# Patient Record
Sex: Male | Born: 1960 | Race: White | Hispanic: No | Marital: Married | Smoking: Former smoker
Health system: Southern US, Community
[De-identification: ages and names within clinical notes are randomized; demographics above are authoritative.]

## PROBLEM LIST (undated history)

## (undated) DIAGNOSIS — C187 Malignant neoplasm of sigmoid colon: Secondary | ICD-10-CM

## (undated) DIAGNOSIS — G5600 Carpal tunnel syndrome, unspecified upper limb: Secondary | ICD-10-CM

## (undated) DIAGNOSIS — K219 Gastro-esophageal reflux disease without esophagitis: Secondary | ICD-10-CM

## (undated) DIAGNOSIS — K429 Umbilical hernia without obstruction or gangrene: Secondary | ICD-10-CM

## (undated) DIAGNOSIS — IMO0001 Reserved for inherently not codable concepts without codable children: Secondary | ICD-10-CM

## (undated) DIAGNOSIS — E876 Hypokalemia: Secondary | ICD-10-CM

## (undated) HISTORY — DX: Carpal tunnel syndrome, unspecified upper limb: G56.00

## (undated) HISTORY — DX: Umbilical hernia without obstruction or gangrene: K42.9

## (undated) HISTORY — PX: CARPAL TUNNEL RELEASE: SHX101

## (undated) HISTORY — DX: Malignant neoplasm of sigmoid colon: C18.7

## (undated) HISTORY — PX: OTHER SURGICAL HISTORY: SHX169

## (undated) HISTORY — DX: Hypokalemia: E87.6

---

## 1980-12-14 HISTORY — PX: APPENDECTOMY: SHX54

## 2006-12-14 DIAGNOSIS — C187 Malignant neoplasm of sigmoid colon: Secondary | ICD-10-CM

## 2006-12-14 HISTORY — DX: Malignant neoplasm of sigmoid colon: C18.7

## 2007-07-07 ENCOUNTER — Inpatient Hospital Stay (HOSPITAL_COMMUNITY): Admission: EM | Admit: 2007-07-07 | Discharge: 2007-07-17 | Payer: Self-pay | Admitting: Emergency Medicine

## 2007-07-07 ENCOUNTER — Ambulatory Visit: Payer: Self-pay | Admitting: Internal Medicine

## 2007-07-08 ENCOUNTER — Encounter: Payer: Self-pay | Admitting: Internal Medicine

## 2007-07-08 ENCOUNTER — Ambulatory Visit: Payer: Self-pay | Admitting: Internal Medicine

## 2007-07-08 ENCOUNTER — Encounter (INDEPENDENT_AMBULATORY_CARE_PROVIDER_SITE_OTHER): Payer: Self-pay | Admitting: General Surgery

## 2007-07-08 HISTORY — PX: FLEXIBLE SIGMOIDOSCOPY: SHX1649

## 2007-08-19 ENCOUNTER — Ambulatory Visit (HOSPITAL_COMMUNITY): Payer: Self-pay | Admitting: Oncology

## 2007-08-24 ENCOUNTER — Ambulatory Visit (HOSPITAL_COMMUNITY): Admission: RE | Admit: 2007-08-24 | Discharge: 2007-08-24 | Payer: Self-pay | Admitting: Oncology

## 2007-09-16 ENCOUNTER — Encounter (HOSPITAL_COMMUNITY): Admission: RE | Admit: 2007-09-16 | Discharge: 2007-10-16 | Payer: Self-pay | Admitting: Oncology

## 2007-10-28 ENCOUNTER — Encounter (HOSPITAL_COMMUNITY): Admission: RE | Admit: 2007-10-28 | Discharge: 2007-11-27 | Payer: Self-pay | Admitting: Oncology

## 2007-10-28 ENCOUNTER — Ambulatory Visit (HOSPITAL_COMMUNITY): Payer: Self-pay | Admitting: Oncology

## 2007-12-05 ENCOUNTER — Encounter (HOSPITAL_COMMUNITY): Admission: RE | Admit: 2007-12-05 | Discharge: 2008-01-04 | Payer: Self-pay | Admitting: Oncology

## 2007-12-14 ENCOUNTER — Ambulatory Visit (HOSPITAL_COMMUNITY): Payer: Self-pay | Admitting: Oncology

## 2007-12-16 ENCOUNTER — Encounter (HOSPITAL_COMMUNITY): Admission: RE | Admit: 2007-12-16 | Discharge: 2008-01-15 | Payer: Self-pay | Admitting: Oncology

## 2008-01-11 ENCOUNTER — Encounter (INDEPENDENT_AMBULATORY_CARE_PROVIDER_SITE_OTHER): Payer: Self-pay | Admitting: General Surgery

## 2008-01-11 ENCOUNTER — Inpatient Hospital Stay (HOSPITAL_COMMUNITY): Admission: RE | Admit: 2008-01-11 | Discharge: 2008-01-21 | Payer: Self-pay | Admitting: General Surgery

## 2008-01-11 ENCOUNTER — Ambulatory Visit: Payer: Self-pay | Admitting: Internal Medicine

## 2008-01-11 ENCOUNTER — Ambulatory Visit (HOSPITAL_COMMUNITY): Admission: RE | Admit: 2008-01-11 | Discharge: 2008-01-11 | Payer: Self-pay | Admitting: Internal Medicine

## 2008-01-11 HISTORY — PX: COLONOSCOPY: SHX174

## 2008-03-13 ENCOUNTER — Ambulatory Visit (HOSPITAL_COMMUNITY): Payer: Self-pay | Admitting: Oncology

## 2008-03-13 ENCOUNTER — Encounter (HOSPITAL_COMMUNITY): Admission: RE | Admit: 2008-03-13 | Discharge: 2008-04-12 | Payer: Self-pay | Admitting: Oncology

## 2008-06-11 ENCOUNTER — Encounter (HOSPITAL_COMMUNITY): Admission: RE | Admit: 2008-06-11 | Discharge: 2008-07-11 | Payer: Self-pay | Admitting: Oncology

## 2008-06-11 ENCOUNTER — Ambulatory Visit (HOSPITAL_COMMUNITY): Payer: Self-pay | Admitting: Oncology

## 2008-06-12 ENCOUNTER — Ambulatory Visit: Payer: Self-pay | Admitting: Gastroenterology

## 2008-07-09 ENCOUNTER — Ambulatory Visit: Payer: Self-pay | Admitting: Internal Medicine

## 2008-07-09 ENCOUNTER — Ambulatory Visit (HOSPITAL_COMMUNITY): Admission: RE | Admit: 2008-07-09 | Discharge: 2008-07-09 | Payer: Self-pay | Admitting: Internal Medicine

## 2008-07-09 ENCOUNTER — Encounter: Payer: Self-pay | Admitting: Internal Medicine

## 2008-07-09 HISTORY — PX: COLONOSCOPY: SHX174

## 2008-09-10 ENCOUNTER — Ambulatory Visit (HOSPITAL_COMMUNITY): Payer: Self-pay | Admitting: Oncology

## 2008-09-10 ENCOUNTER — Encounter (HOSPITAL_COMMUNITY): Admission: RE | Admit: 2008-09-10 | Discharge: 2008-10-10 | Payer: Self-pay | Admitting: Oncology

## 2008-12-03 ENCOUNTER — Ambulatory Visit (HOSPITAL_COMMUNITY): Payer: Self-pay | Admitting: Oncology

## 2008-12-03 ENCOUNTER — Encounter (HOSPITAL_COMMUNITY): Admission: RE | Admit: 2008-12-03 | Discharge: 2008-12-12 | Payer: Self-pay | Admitting: Oncology

## 2009-01-07 ENCOUNTER — Encounter (HOSPITAL_COMMUNITY): Admission: RE | Admit: 2009-01-07 | Discharge: 2009-02-06 | Payer: Self-pay | Admitting: Oncology

## 2009-03-04 ENCOUNTER — Encounter (HOSPITAL_COMMUNITY): Admission: RE | Admit: 2009-03-04 | Discharge: 2009-04-03 | Payer: Self-pay | Admitting: Oncology

## 2009-03-04 ENCOUNTER — Ambulatory Visit (HOSPITAL_COMMUNITY): Payer: Self-pay | Admitting: Oncology

## 2009-06-03 ENCOUNTER — Encounter (HOSPITAL_COMMUNITY): Admission: RE | Admit: 2009-06-03 | Discharge: 2009-07-03 | Payer: Self-pay | Admitting: Oncology

## 2009-06-03 ENCOUNTER — Ambulatory Visit (HOSPITAL_COMMUNITY): Payer: Self-pay | Admitting: Oncology

## 2009-07-08 ENCOUNTER — Encounter: Payer: Self-pay | Admitting: Internal Medicine

## 2009-08-26 ENCOUNTER — Ambulatory Visit (HOSPITAL_COMMUNITY): Payer: Self-pay | Admitting: Oncology

## 2009-08-26 ENCOUNTER — Encounter (HOSPITAL_COMMUNITY): Admission: RE | Admit: 2009-08-26 | Discharge: 2009-09-11 | Payer: Self-pay | Admitting: Oncology

## 2009-11-25 ENCOUNTER — Ambulatory Visit (HOSPITAL_COMMUNITY): Payer: Self-pay | Admitting: Oncology

## 2009-11-25 ENCOUNTER — Encounter (HOSPITAL_COMMUNITY): Admission: RE | Admit: 2009-11-25 | Discharge: 2009-12-10 | Payer: Self-pay | Admitting: Oncology

## 2009-12-25 ENCOUNTER — Encounter (HOSPITAL_COMMUNITY): Admission: RE | Admit: 2009-12-25 | Discharge: 2010-01-24 | Payer: Self-pay | Admitting: Oncology

## 2009-12-27 ENCOUNTER — Encounter: Payer: Self-pay | Admitting: Internal Medicine

## 2009-12-30 ENCOUNTER — Ambulatory Visit: Payer: Self-pay | Admitting: Internal Medicine

## 2009-12-30 ENCOUNTER — Ambulatory Visit (HOSPITAL_COMMUNITY): Admission: RE | Admit: 2009-12-30 | Discharge: 2009-12-30 | Payer: Self-pay | Admitting: Internal Medicine

## 2009-12-30 HISTORY — PX: COLONOSCOPY: SHX174

## 2010-01-01 ENCOUNTER — Ambulatory Visit (HOSPITAL_COMMUNITY): Admission: RE | Admit: 2010-01-01 | Discharge: 2010-01-01 | Payer: Self-pay | Admitting: Oncology

## 2010-01-21 ENCOUNTER — Encounter: Payer: Self-pay | Admitting: Internal Medicine

## 2010-01-23 ENCOUNTER — Encounter: Payer: Self-pay | Admitting: Internal Medicine

## 2010-03-17 ENCOUNTER — Ambulatory Visit (HOSPITAL_COMMUNITY): Payer: Self-pay | Admitting: Oncology

## 2010-03-17 ENCOUNTER — Encounter (HOSPITAL_COMMUNITY): Admission: RE | Admit: 2010-03-17 | Discharge: 2010-04-16 | Payer: Self-pay | Admitting: Oncology

## 2010-04-24 ENCOUNTER — Encounter: Payer: Self-pay | Admitting: Internal Medicine

## 2010-12-26 ENCOUNTER — Encounter (HOSPITAL_COMMUNITY)
Admission: RE | Admit: 2010-12-26 | Discharge: 2011-01-13 | Payer: Self-pay | Source: Home / Self Care | Attending: Oncology | Admitting: Oncology

## 2010-12-26 ENCOUNTER — Ambulatory Visit (HOSPITAL_COMMUNITY)
Admission: RE | Admit: 2010-12-26 | Discharge: 2011-01-13 | Payer: Self-pay | Source: Home / Self Care | Attending: Oncology | Admitting: Oncology

## 2010-12-29 LAB — DIFFERENTIAL
Basophils Absolute: 0 10*3/uL (ref 0.0–0.1)
Basophils Relative: 1 % (ref 0–1)
Eosinophils Absolute: 0.3 10*3/uL (ref 0.0–0.7)
Eosinophils Relative: 4 % (ref 0–5)
Lymphocytes Relative: 35 % (ref 12–46)
Lymphs Abs: 2.4 10*3/uL (ref 0.7–4.0)
Monocytes Absolute: 0.6 10*3/uL (ref 0.1–1.0)
Monocytes Relative: 8 % (ref 3–12)
Neutro Abs: 3.6 10*3/uL (ref 1.7–7.7)
Neutrophils Relative %: 52 % (ref 43–77)

## 2010-12-29 LAB — CBC
HCT: 43 % (ref 39.0–52.0)
Hemoglobin: 14.7 g/dL (ref 13.0–17.0)
MCH: 28.4 pg (ref 26.0–34.0)
MCHC: 34.2 g/dL (ref 30.0–36.0)
MCV: 83.2 fL (ref 78.0–100.0)
Platelets: 206 10*3/uL (ref 150–400)
RBC: 5.17 MIL/uL (ref 4.22–5.81)
RDW: 14 % (ref 11.5–15.5)
WBC: 6.9 10*3/uL (ref 4.0–10.5)

## 2010-12-29 LAB — COMPREHENSIVE METABOLIC PANEL
ALT: 28 U/L (ref 0–53)
AST: 26 U/L (ref 0–37)
Albumin: 4.1 g/dL (ref 3.5–5.2)
Alkaline Phosphatase: 69 U/L (ref 39–117)
BUN: 18 mg/dL (ref 6–23)
CO2: 26 mEq/L (ref 19–32)
Calcium: 9.1 mg/dL (ref 8.4–10.5)
Chloride: 104 mEq/L (ref 96–112)
Creatinine, Ser: 1.07 mg/dL (ref 0.4–1.5)
GFR calc Af Amer: >60 mL/min (ref >60)
GFR calc non Af Amer: >60 mL/min (ref >60)
Glucose, Bld: 94 mg/dL (ref 70–99)
Potassium: 3.9 mEq/L (ref 3.5–5.1)
Sodium: 140 mEq/L (ref 135–145)
Total Bilirubin: 0.4 mg/dL (ref 0.3–1.2)
Total Protein: 6.3 g/dL (ref 6.0–8.3)

## 2010-12-29 LAB — CEA: CEA: <0.5 ng/mL (ref 0.0–5.0)

## 2011-01-04 ENCOUNTER — Encounter (HOSPITAL_COMMUNITY): Payer: Self-pay | Admitting: Oncology

## 2011-01-08 ENCOUNTER — Encounter: Payer: Self-pay | Admitting: Internal Medicine

## 2011-01-14 NOTE — Letter (Signed)
Summary: [facility]   Imported By: Diana Eves 01/23/2010 11:46:23  _____________________________________________________________________  External Attachment:    Type:   Image     Comment:   External Document

## 2011-01-14 NOTE — Letter (Signed)
Summary: [facility]   Imported By: Diana Eves 12/27/2009 14:06:41  _____________________________________________________________________  External Attachment:    Type:   Image     Comment:   External Document

## 2011-01-14 NOTE — Letter (Signed)
Summary: [facility]   Imported By: Diana Eves 12/27/2009 13:59:59  _____________________________________________________________________  External Attachment:    Type:   Image     Comment:   External Document

## 2011-01-14 NOTE — Letter (Signed)
Summary: External Other  External Other   Imported By: Peggyann Shoals 04/24/2010 09:51:11  _____________________________________________________________________  External Attachment:    Type:   Image     Comment:   External Document

## 2011-01-14 NOTE — Letter (Signed)
Summary: [facility]   Imported By: Diana Eves 01/21/2010 16:50:03  _____________________________________________________________________  External Attachment:    Type:   Image     Comment:   External Document

## 2011-01-15 NOTE — Letter (Signed)
Summary: [facility]  [facility]   Imported By: Rexene Alberts 01/08/2011 09:40:13  _____________________________________________________________________  External Attachment:    Type:   Image     Comment:   External Document

## 2011-03-02 LAB — GLUCOSE, CAPILLARY: Glucose-Capillary: 92 mg/dL (ref 70–99)

## 2011-03-04 LAB — DIFFERENTIAL
Basophils Absolute: 0 10*3/uL (ref 0.0–0.1)
Basophils Relative: 1 % (ref 0–1)
Eosinophils Absolute: 0.3 10*3/uL (ref 0.0–0.7)
Eosinophils Relative: 4 % (ref 0–5)
Lymphocytes Relative: 37 % (ref 12–46)
Lymphs Abs: 3 10*3/uL (ref 0.7–4.0)
Monocytes Absolute: 0.7 10*3/uL (ref 0.1–1.0)
Monocytes Relative: 9 % (ref 3–12)
Neutro Abs: 4 10*3/uL (ref 1.7–7.7)
Neutrophils Relative %: 50 % (ref 43–77)

## 2011-03-04 LAB — CEA: CEA: 0.8 ng/mL (ref 0.0–5.0)

## 2011-03-04 LAB — COMPREHENSIVE METABOLIC PANEL
ALT: 27 U/L (ref 0–53)
AST: 26 U/L (ref 0–37)
Albumin: 4.4 g/dL (ref 3.5–5.2)
Alkaline Phosphatase: 74 U/L (ref 39–117)
BUN: 17 mg/dL (ref 6–23)
CO2: 30 mEq/L (ref 19–32)
Calcium: 9.6 mg/dL (ref 8.4–10.5)
Chloride: 104 mEq/L (ref 96–112)
Creatinine, Ser: 1.06 mg/dL (ref 0.4–1.5)
GFR calc Af Amer: >60 mL/min (ref >60)
GFR calc non Af Amer: >60 mL/min (ref >60)
Glucose, Bld: 90 mg/dL (ref 70–99)
Potassium: 4 mEq/L (ref 3.5–5.1)
Sodium: 139 mEq/L (ref 135–145)
Total Bilirubin: 0.5 mg/dL (ref 0.3–1.2)
Total Protein: 7.3 g/dL (ref 6.0–8.3)

## 2011-03-04 LAB — CBC
HCT: 46.9 % (ref 39.0–52.0)
Hemoglobin: 15.9 g/dL (ref 13.0–17.0)
MCHC: 33.9 g/dL (ref 30.0–36.0)
MCV: 84.4 fL (ref 78.0–100.0)
Platelets: 225 10*3/uL (ref 150–400)
RBC: 5.56 MIL/uL (ref 4.22–5.81)
RDW: 13.9 % (ref 11.5–15.5)
WBC: 8.1 10*3/uL (ref 4.0–10.5)

## 2011-03-17 LAB — COMPREHENSIVE METABOLIC PANEL
ALT: 26 U/L (ref 0–53)
AST: 22 U/L (ref 0–37)
Albumin: 4.1 g/dL (ref 3.5–5.2)
Alkaline Phosphatase: 74 U/L (ref 39–117)
BUN: 15 mg/dL (ref 6–23)
CO2: 25 mEq/L (ref 19–32)
Calcium: 9.3 mg/dL (ref 8.4–10.5)
Chloride: 105 mEq/L (ref 96–112)
Creatinine, Ser: 0.88 mg/dL (ref 0.4–1.5)
GFR calc Af Amer: >60 mL/min (ref >60)
GFR calc non Af Amer: >60 mL/min (ref >60)
Glucose, Bld: 101 mg/dL — ABNORMAL HIGH (ref 70–99)
Potassium: 3.7 mEq/L (ref 3.5–5.1)
Sodium: 140 mEq/L (ref 135–145)
Total Bilirubin: 0.5 mg/dL (ref 0.3–1.2)
Total Protein: 6.9 g/dL (ref 6.0–8.3)

## 2011-03-17 LAB — DIFFERENTIAL
Basophils Absolute: 0 10*3/uL (ref 0.0–0.1)
Lymphs Abs: 2.4 10*3/uL (ref 0.7–4.0)
Monocytes Absolute: 0.3 10*3/uL (ref 0.1–1.0)
Monocytes Relative: 5 % (ref 3–12)
Neutro Abs: 3.8 10*3/uL (ref 1.7–7.7)

## 2011-03-17 LAB — CBC
HCT: 45.3 % (ref 39.0–52.0)
Hemoglobin: 15.2 g/dL (ref 13.0–17.0)
MCHC: 33.5 g/dL (ref 30.0–36.0)
MCV: 86.6 fL (ref 78.0–100.0)
Platelets: 179 10*3/uL (ref 150–400)
RBC: 5.23 MIL/uL (ref 4.22–5.81)
RDW: 13.7 % (ref 11.5–15.5)
WBC: 6.8 10*3/uL (ref 4.0–10.5)

## 2011-03-17 LAB — CEA: CEA: 0.8 ng/mL (ref 0.0–5.0)

## 2011-03-20 LAB — COMPREHENSIVE METABOLIC PANEL
CO2: 31 mEq/L (ref 19–32)
Calcium: 9.6 mg/dL (ref 8.4–10.5)
Creatinine, Ser: 1 mg/dL (ref 0.4–1.5)
GFR calc Af Amer: >60 mL/min (ref >60)
GFR calc non Af Amer: >60 mL/min (ref >60)
Glucose, Bld: 83 mg/dL (ref 70–99)

## 2011-03-20 LAB — CBC
HCT: 48.1 % (ref 39.0–52.0)
Hemoglobin: 16.3 g/dL (ref 13.0–17.0)
MCHC: 33.8 g/dL (ref 30.0–36.0)
RDW: 14.4 % (ref 11.5–15.5)

## 2011-03-23 LAB — CEA: CEA: 1 ng/mL (ref 0.0–5.0)

## 2011-03-24 ENCOUNTER — Encounter (HOSPITAL_COMMUNITY): Payer: Federal, State, Local not specified - PPO | Attending: Oncology

## 2011-03-24 DIAGNOSIS — Z85038 Personal history of other malignant neoplasm of large intestine: Secondary | ICD-10-CM | POA: Insufficient documentation

## 2011-03-24 DIAGNOSIS — C189 Malignant neoplasm of colon, unspecified: Secondary | ICD-10-CM

## 2011-03-26 LAB — CEA: CEA: 1 ng/mL (ref 0.0–5.0)

## 2011-04-28 NOTE — H&P (Signed)
[name], [name] NO.:  [id number]   MEDICAL RECORD NO.:  [id number]          PATIENT TYPE:  INP   LOCATION:  A319                          FACILITY:  [facility]   PHYSICIAN:  Osvaldo Shipper, MD     DATE OF BIRTH:  [birth year]   DATE OF ADMISSION:  07/07/2007  DATE OF DISCHARGE:  LH                              HISTORY & PHYSICAL   Patient lives in [place], [place] and does not have a PMD here.   ADMISSION DIAGNOSES:  1. Sigmoid colon obstruction, likely tumor.  2. Nausea, vomiting, constipation for the past one week.   CHIEF COMPLAINT:  Nausea, vomiting, and constipation for one week.   HISTORY OF PRESENT ILLNESS:  Patient is a 50 year old Caucasian male who  really does not have any medical problems, who was fine up until about a  month and a half ago when he was at a [employer]  for two weeks when he did not have a BM for almost one week.  Subsequently, he started having bowel movements and then about last  Friday, it was the last time he had a bowel movement.  Then about  Saturday of last week, he noticed abdominal pain, pretty much diffusely  but mostly in the lower part of the abdomen, cramping-type pain, 8/10 in  intensity, with no radiation of the pain.  Associated with nausea and  then subsequently vomiting, which started yesterday at about 7-8  episodes of emesis, not necessarily projectile.  He had some yellowish  material come out of his emesis.  He denied any blood.  He denied any  melena or any blood in his stool when he was having bowel movements.  Admits to a weight loss of about 10 pounds over the last month and a  half.  Denies any swollen lymph nodes.  He has never had a colonoscopy  in the past.   HOME MEDICATIONS:  He is not really on any medications on a regular  basis.   ALLERGIES:  CODEINE, which caused nausea and vomiting.   Past medical history consists of just surgeries in the form of  appendectomy, carpal  tunnel syndrome, and arm surgery for broken bones.  Denies any diabetes, hypertension, heart disease, strokes, previous  cancers.   SOCIAL HISTORY:  Lives in [place], [place] with his wife and daughter.  He works in the [employer] full-time.  He smokes a pack and a half  of cigarettes on a daily basis.  No alcohol use.  No illicit drug use.  Independent with his daily activities.   FAMILY HISTORY:  Father died of heart disease.  He had emphysema.  Mother had some kind of a pancreatic problem, he is not sure.  There is  a history of stroke in the grandparents, otherwise no history of colon  cancer or other cancers in the family.   REVIEW OF SYSTEMS:  GENERAL:  Positive for weakness.  HEENT:  Unremarkable.  CARDIOVASCULAR:  Unremarkable.  RESPIRATORY:  Unremarkable.  GI:  As in HPI.  GU:  Unremarkable.  MUSCULOSKELETAL:  Unremarkable.  LYMPHATICS:  Unremarkable.  NEUROLOGIC:  Unremarkable.  Other systems unremarkable.   PHYSICAL EXAMINATION:  VITAL SIGNS:  Temperature is 97.5, blood pressure  121/78, heart rate 80, respiratory rate 16, saturation 99% on room air.  GENERAL:  Well-developed and well-nourished individual in no distress.  HEENT:  There is no pallor, no icterus.  Oral mucous membranes are  moist.  No oral lesions are noted.  NECK:  Soft and supple.  No thyromegaly is appreciated.  LUNGS:  Clear to auscultation bilaterally.  CARDIOVASCULAR:  S1 and S2 is normal.  Regular.  No S3 or S4.  No  murmurs.  No rubs.  ABDOMEN:  Soft.  There is some tenderness in the lower part of the  abdomen bilaterally, right and left sides.  No rebound, rigidity, or  guarding.  No masses felt.  No hepatomegaly or splenomegaly is  appreciated.  LYMPHATICS:  Unremarkable.  MUSCULOSKELETAL:  Unremarkable.  EXTREMITIES:  No edema in the lower extremities.  Peripheral pulses are  palpable.  RECTAL:  Deferred at this time.  NEUROLOGIC:  Alert and oriented x3.  No focal neurological deficits  are  present.   LAB DATA:  White count is 19,500, hemoglobin 18, MCV 84, platelet count  311, 86% neutrophils on the differential.  His CMET shows a glucose of  136, otherwise unremarkable.  His urine shows moderate bilirubin,  ketones otherwise unremarkable.   CT scan of the abdomen and pelvis was done with contrast that showed  distended proximal colon and portions of small bowel, consistent with a  distant colonic obstruction, irregular narrowing of the sigmoid colon,  worrisome for sigmoid carcinoma was noted.  Slight prominence of the CBD  and the PD was noted.   ASSESSMENT/PLAN:  This is a 50 year old Caucasian male with no medical  problems who presents with a one week history of constipation, nausea,  vomiting, and pain and was found to have an obstructive lesion in the  sigmoid colon based on CT.  Differential includes cancer, which is the  most likely explanation.   PLAN:  1. Colonic obstruction with sigmoid mass.  GI has already seen him,      and the plan is for the patient to undergo a sigmoidoscopy tomorrow      to see if they can get biopsies.  He will probably need surgical      intervention as well.  I will defer those issues to GI.  Pain      management will be provided.  He will have an NG-tube placed per      GI.  Nicotine patch will be prescribed.  Smoking cessation      counseling provided.  2. DVT/GI prophylaxis will be provided.  3. Patient is a full code at this time.  All of his questions and his      family's questions are answered to their satisfaction.      Osvaldo Shipper, MD  Electronically Signed     GK/MEDQ  D:  07/07/2007  T:  07/07/2007  Job:  [id number]   cc:   R. Roetta Sessions, M.D.  [address]  [place]  [place] [address]   [name], M.D.  Fax: [phone]

## 2011-04-28 NOTE — Discharge Summary (Signed)
NAMEACHERON, SUGG NO.:  [id number]   MEDICAL RECORD NO.:  [id number]          PATIENT TYPE:  INP   LOCATION:  A330                          FACILITY:  [facility]   PHYSICIAN:  [name], M.D. 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   PROCEDURE:  On January 11, 2008, the patient had a colonoscopy via  colostomy by Dr. Jena Gauss and then a reversal of the [facility] procedure  with a colectomy and primary anastomosis by me on January 11, 2008 as  well.   Pathology revealed no recurrent tumor on the excised specimens.   HOSPITAL COURSE:  The hospital course was unremarkable.  Essentially,  this is a 50 year old male who presented with an obstructing sigmoid  colon.  He was treated with a colectomy and colostomy because of the  obstructive nature of his disease.  Postoperatively he did well from  that surgery and was treated by Dr. Mariel Sleet with chemotherapy, and  after the chemotherapy was terminated we then had plans to reverse his  colostomy after clearing the rest of his colon by colonoscopy.  He was  admitted on January 11, 2008, and, as stated, he underwent these  procedures uneventfully.  Postoperatively he had somewhat of a prolonged  ileus, which is understandable; however, he did well.  His surgery was  uneventful.  He underwent a stapled side-to-side anastomosis, and his  wound postoperatively remained clean.  His colostomy site was packed  open.  Wound care was administered to this area, and it was granulating  very well, and I decided to try for a closure of this.  This was carried  out on the ninth postoperative day.  On the tenth postoperative day, the  wound remained clean looking, and there was no sign of infection.  His  hospital course otherwise was unremarkable.  His NG tube was removed on  about the sixth or seventh postoperative day.  Diet and activity were  advanced as tolerated.  He continued to improve postoperatively without  any untoward signs.   LABORATORY DATA:  His pathology is as mentioned above.  His CBC and MET-  7 were grossly within normal limits postoperatively as was all of his  laboratory data postoperatively.   He is discharged now on the tenth postoperative day.  We have plans to  follow up with him next week.  He is told to contact me if there are  any problems with this colostomy site.  He was explained that this could  possibly become infected, however, to give [place] a call and that this was  not a serious problem but would require opening the wound and irrigating  it as was done prior to his closure.  He is told to contact us should  there be any other problems.   DISCHARGE MEDICATIONS:  He is to told to continue his potassium and  magnesium as per Dr. Mariel Sleet,  and he is told to clean his wound with  alcohol 2 or 3 times a day, and he is given those special instructions  regarding his previous colostomy site.  He has some Darvocet N 100 and  he is to take that every 4 hours if Advil is not enough for pain.  He is  told to contact us or come to the emergency room should there be any  acute changes.      [name], M.D.  Electronically Signed     WB/MEDQ  D:  01/21/2008  T:  01/23/2008  Job:  [id number]   cc:   Ladona Horns. Mariel Sleet, MD  Fax: [phone]

## 2011-04-28 NOTE — Op Note (Signed)
[name], MCNEILL NO.:  [id number]   MEDICAL RECORD NO.:  [id number]          PATIENT TYPE:  AMB   LOCATION:  DAY                           [facility]:  [facility]   PHYSICIAN:  [name], M.D. DATE OF BIRTH:  [birth year]   DATE OF PROCEDURE:  01/11/2008  DATE OF DISCHARGE:                               OPERATIVE REPORT   SURGEON:  [name], M.D.   PREOPERATIVE DIAGNOSIS:  Sigmoid colon carcinoma.   POSTOPERATIVE DIAGNOSIS:  Sigmoid colon carcinoma.   PROCEDURE:  Partial colectomy with anastomosis status post Hartmann  procedure (staged procedure).   SPECIMEN:  Portions of sigmoid colon.   NOTE:  This is a 50 year old white male who presented with an  obstructing sigmoid carcinoma.  He was resected and we had plans after  his chemotherapy had been completed to reverse his end colostomy.  Prior  to doing this, the GI service performed a colonoscopy through his  colostomy in order to clear his proximal colon as the visualization of  his proximal colon prior to his sigmoid resection was not adequate due  to the obstructive nature of his disease when he presented initially.  He completed his chemotherapy and his colonoscopy was planned.  He  underwent a mechanical prep as an outpatient and the GI service then  performed the colonoscopy through the colostomy and there were no  lesions found proximally.   DESCRIPTION OF PROCEDURE:  We then took him to the operating room where  we prepped his abdomen and draped in usual manner.  I isolated the  incision from the colostomy area which I dissected and then stapled free  the end of and then opened the abdomen in the midline and performed a  rather extensive adhesiolysis of his small bowel and colon in order to  free up the distal end as well as the area around his proximal colon.  I  then performed a stapled side-to-side anastomosis using the GIA and TA-  60 4.5 stapler.  The ends of the anastomosis were  then oversewn with 3-0  GI silk.  We then changed gloves, irrigated the abdomen, and there was  some oozing around where the psoas muscle was where there were  adhesions.  These were controlled with a coagulant spray.  The abdomen  was then irrigated.  We checked for hemostasis and this was complete.  There was a good anastomosis.  We then closed the abdomen in the midline  with 0 Prolene in interrupted figure-of-eight sutures.  The subcu was  irrigated and the skin was approximated with the stapling device.  The  peritoneum was closed from the inside where the colostomy was during the  procedure and we then closed the fascia with 0 Polysorb sutures and  packed it open with saline soaked gauze.  A sterile OpSite dressing was  applied over the skin incision and gauze dressing was applied over the  colostomy site.  Prior to closure, all sponge, needle and instrument  counts were found to be correct.  Estimated blood loss was approximately  300 mL.  The patient received approximately 4 liters of  crystalloid  intraoperatively.  There were no complications.      [name], M.D.  Electronically Signed     WB/MEDQ  D:  01/11/2008  T:  01/11/2008  Job:  [id number]   cc:   Ladona Horns. Mariel Sleet, MD  Fax: [phone]   R. Roetta Sessions, M.D.  [address]  [place]  [place] [address]

## 2011-04-28 NOTE — Op Note (Signed)
NAMEJAGO, CARTON            ACCOUNT NO.:  [id number]   MEDICAL RECORD NO.:  [id number]          PATIENT TYPE:  AMB   LOCATION:  DAY                           [facility]   PHYSICIAN:  R. Roetta Sessions, M.D. DATE OF BIRTH:  12/11/1961   DATE OF PROCEDURE:  07/09/2008  DATE OF DISCHARGE:                               OPERATIVE REPORT   Colonoscopy with  snare polypectomy.   INDICATIONS FOR PROCEDURE:  A 50 year old gentleman with a sigmoid colon  cancer status post resection one year ago with a diverting colostomy,  was taken down earlier this year.  He is here for one-year surveillance  of his neo lower GI tract.  This approach has been discussed with the  patient at length.  Risks, benefits, and alternatives have been  reviewed.  Questions were answered.  He is agreeable.  Please see the  documentation in the medical record.   PROCEDURE NOTE:  O2 saturation, blood pressure, and pulse of the patient  monitored throughout the entire procedure.  Conscious sedation, Versed 6  mg IV, and Demerol 150 mg IV in divided doses.   INSTRUMENT:  Pentax video chip system.   FINDINGS:  Digital rectal exam revealed no abnormalities.  Endoscopic  findings:  The prep was adequate, but suboptimal on the right side.  Colon:  Colonic mucosa was surveyed from the rectosigmoid junction  through the left transverse, right colon, appendiceal orifice, ileocecal  valve, and cecum.  These structures were well seen and photographed for  the record.  From this level, the scope was slowly and cautiously  withdrawn.  All previously mentioned mucosal surfaces were again seen.  The encircled anastomosis was identified at 22 cm from the anal verge.  There was a 5-mm polypoid lesion at the anastomosis found and appeared  to be possibly an adenomatous polyp or some amount of granulation tissue  from a recent surgery.  Please see photos.  The remainder of the colonic  mucosa appeared normal.  This lesion  was resected with a hot snare  cautery, recovered through the scope.  The remainder of the residual  colon appeared normal.  Scope was pulled down to rectum where thorough  examination of the rectal mucosa including retroflexed view of the anal  verge demonstrated no abnormalities.  The patient tolerated the  procedure well and was reactive in endoscopy.   IMPRESSION:  Normal rectum, surgical anastomosis was 22 cm, polypoid  tissue at anastomosis that was a recurrent neoplasm, it was removed with  hot snare.  The remainder of residual colon appeared normal.  Relative  poor prep on the right side made exam more challenging.   RECOMMENDATIONS:  1. No aspirin or arthritis medications for 5 days.  2. Follow up on path.  3. Further recommendations to follow.      [name], M.D.  Electronically Signed     RMR/MEDQ  D:  07/09/2008  T:  07/10/2008  Job:  [id number]   cc:   [name], M.D.  Fax: [phone]   Ladona Horns. Mariel Sleet, MD  Fax: [phone]   Dossie Arbour, M.D.

## 2011-04-28 NOTE — Discharge Summary (Signed)
NAMEGAYLON, BENTZ NO.:  [id number]   MEDICAL RECORD NO.:  [id number]          PATIENT TYPE:  INP   LOCATION:  A319                          [facility]:  [facility]   PHYSICIAN:  [name], M.D. DATE OF BIRTH:  1961/09/04   DATE OF ADMISSION:  07/07/2007  DATE OF DISCHARGE:  08/03/2008LH                               DISCHARGE SUMMARY   DIAGNOSIS:  Obstructing sigmoid carcinoma.   CONSULTATIONS:  1. GI service.  2. Surgery Service.   PROCEDURE:  1. On July 08, 2007, flexible sigmoidoscopy with polypectomy and      biopsy of sigmoid carcinoma.  2. On July 08, 2007, sigmoid resection with end colostomy (staged      procedure).   PATHOLOGY REPORT:  Moderately differentiated invasive adenocarcinoma of  the sigmoid colon, margins clear, maximum tumor size 4 cm, 6 out of 22  lymph nodes with metastatic adenocarcinoma, distal margin 1 cm, proximal  margin free of tumor as well.   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  He underwent a flexible sigmoidoscopy which revealed a tumor  totally obstructing the sigmoid colon.  He was decompressed with NG  suction and parenteral antibiotics were initiated and Surgery was  consulted and he was taken to surgery, at which time the obstruction was  relieved by a sigmoid resection and an end colostomy, which revealed the  above-mentioned pathology; this is a staged procedure and the patient  will undergo reanastomosis and further resection of edges of tumor to  make sure that we remain margin-free.   HOSPITAL STAY:  This patient did well postoperatively.  He did not  require any intensive care unit stay.  He did have a prolonged ileus,  which was expected, as this patient had had literally weeks of  obstructive symptoms and a very dilated atonic bowel which took some  time to regain its tone and normal peristalsis.   The patient  postoperatively did well, apart from that, and his NG tube was removed  on the 7th postoperative day and on the 9th postoperative day, he was  tolerating p.o. and soft diet without nausea or vomiting.  His colostomy  function was good with good stoma viability and the patient had been  instructed by the enterostomy nurse.  At the time of discharge, he was  afebrile.  His wounds were clean, I removed alternate staples and have  made followup arrangements to see him later and he was doing well  without dysuria, shortness of breath or any signs of wound infection.   LABORATORY DATA:  Pathology report as mentioned above.  His white count  on July 31 was 8.9 with an H&H of 13.8 and 41.3, normal differential.  His electrolytes were within normal limits and his preoperative CEA  level was of 2.1.   CT scan showed some small bowel and large bowel dilatation with  obstruction of the sigmoid colon as noted.  No other acute abnormalities  suggesting liver metastasis or any other metastasis were noted on  CT  scan or were noted intraoperatively.   DISCHARGE INSTRUCTIONS:  The patient is discharged with:  1. Nicotine patch 21 mg a day.  2. Albuterol two puffs q.6 h. p.r.n.  3. Karaya powder was given for added colostomy care and this was      explained to him.  4. Extra-Strength Tylenol is all he really required for pain, as he      was doing quite well and did not need any further narcotics.   ACTIVITY:  He is excused from work.   DIET:  As far as his diet goes, he is on an unrestricted diet.  He is to  chew his food real well and continued to take plenty of fluids and he is  told to take a multivitamin capsule daily.   SPECIAL DISCHARGE INSTRUCTIONS:  He is permitted to shower.  He is told  to do no heavy lifting, straining, or vigorous sexual activity; this was  explained in detail.   He is told to clean his wound with alcohol and he has been given as a  good colostomy  instruction and he is quite comfortable with this.  We  have made followup arrangements to see him perioperatively and we will  also have him see Dr. Mariel Sleet to discuss adjuvant therapy with him and  we will coordinate reanastomosis with him after this discussion with Dr.  Mariel Sleet.  He is told to contact me should he have any acute changes or  any problems.      [name], M.D.  Electronically Signed     WB/MEDQ  D:  07/17/2007  T:  07/18/2007  Job:  [id number]   cc:   R. Roetta Sessions, M.D.  [address]  [place]  [place] [address]   Ladona Horns. Mariel Sleet, MD  Fax: [phone]   Hospitalist Service

## 2011-04-28 NOTE — Op Note (Signed)
[name], [name]            ACCOUNT NO.:  [id number]   MEDICAL RECORD NO.:  [id number]          PATIENT TYPE:  INP   LOCATION:  A319                          FACILITY:  [facility]   PHYSICIAN:  R. Roetta Sessions, M.D. DATE OF BIRTH:  10-31-1961   DATE OF PROCEDURE:  07/08/2007  DATE OF DISCHARGE:                               OPERATIVE REPORT   PROCEDURE PERFORMED:  Flexible sigmoidoscopy with biopsy, snare  polypectomy, and polyp ablation.   INDICATIONS FOR PROCEDURE:  The patient is a 50 year old Caucasian male  admitted to the hospital with a large bowel obstruction. CT suggests  apple core neoplasm in the left colon.  Sigmoidoscopy is now being done  to establish a diagnosis. Surgery consultation is pending.  This  approach has been discussed with Mr. Persky and his wife.  The  potential risks, benefits and alternatives have been reviewed.   PROCEDURE NOTE:  The patient was placed in the left lateral decubitus  position.  O2 saturation, blood pressure, pulse rate was monitored  throughout the entire procedure.  Conscious sedation with Versed 2 mg IV  and Demerol 50 mg IV. Instrument Pentax video chip system.   Digital rectal exam revealed no abnormalities.  Prep was fair.  Examination of the rectal mucosa including retroflex to view the anal  verge demonstrated a 1 cm multilobulated polyp at 10 cm from the anal  verge.  The remainder of the rectal mucosa appeared normal.  The colonic  mucosa was surveyed from the rectosigmoid junction where the scope was  advanced in a nice 1/1 fashion up to approximately 35 cm where  obstructing apple core appearing neoplasm was located.  There was no  apparent lumen found.  The distal aspect of this lesion was biopsied  multiple times for histologic study.  There were also multiple  diminutive polyps between the distal aspect of this tumor, the sigmoid  and the rectum.  These polyps were ablated using the tip of the hot  snare cautery  unit.  The polyp in the rectum was totally resected with  one pass of the hot snare cautery unit and the polyp was submitted to  the pathologist with the other specimens.  The patient tolerated the  procedure well, was reacted in endoscopy.   IMPRESSION:  1. Lobulated 1 cm rectal polyp at 10 cm, resected as described above.      Otherwise, normal rectum.  2. Multiple distal sigmoid diminutive polyps ablated with the tip of      hot snare cautery unit.  3. Obstructing apple core neoplasm beginning at 35 cm from the anal      verge, biopsied multiple times.   RECOMMENDATIONS:  Will leave NG in place, proceed with surgical  consultation as previously recommended.      [name], M.D.  Electronically Signed     RMR/MEDQ  D:  07/08/2007  T:  07/08/2007  Job:  [id number]

## 2011-04-28 NOTE — Op Note (Signed)
[name], [name]            ACCOUNT NO.:  [id number]   MEDICAL RECORD NO.:  [id number]          PATIENT TYPE:  END   LOCATION:  DAY                           FACILITY:  [facility]   PHYSICIAN:  R. Roetta Sessions, M.D. DATE OF BIRTH:  1961-06-02   DATE OF PROCEDURE:  01/11/2008  DATE OF DISCHARGE:                               OPERATIVE REPORT   PROCEDURE:  Colonoscopy.   INDICATIONS FOR PROCEDURE:  A 50 year old gentleman presented with  obstructing sigmoid colon cancer.  Previously he is status post  resection and diverting colostomy.  He has done well.  He is coming for  surgical takedown later today since he has not had a substream colon  image, he is here to have that done prior to takedown of colostomy.  This approach has been discussed with the patient at length.  Potential  risks, benefits, and alternatives have been reviewed.  Questions are  answered and he is agreeable.  Please see documentation in medical  record.   DESCRIPTION OF PROCEDURE:  O2 saturation, blood pressure, and pulse on  this patient were monitored throughout the entire procedure.  Conscious  sedation with Versed 3 mg IV, Demerol 75 mg IV in divided doses.  Instrument was Pentax video chip system.   FINDINGS:  Examination of colostomy, digitalization revealed no  abnormalities.  Endoscopic findings and prep was good.  Examination of  the colonic mucosa was carried out through the colostomy.  The scope was  advanced to the cecum easily.  Cecum, ileocecal valve, appendiceal  orifice were well seen and photographed for the record.  From this  level, the scope was slowly and cautiously withdrawn.  All previously  mentioned mucosal surfaces were again seen.  The colonic mucosa from the  cecum to the colostomy appeared normal.  The patient tolerated the  procedure very well.   IMPRESSION:  Colonic mucosa from the colostomy to the cecum appeared  normal.   RECOMMENDATIONS:  Proceed with surgical takedown as  planned per [name], M.D.  I would plan to bring him back for a repeat colonoscopy  in 6 months.      [name], M.D.  Electronically Signed     RMR/MEDQ  D:  01/27/2008  T:  01/28/2008  Job:  [id number]   cc:   [name], M.D.  Fax: [phone]   Ladona Horns. Mariel Sleet, MD  Fax: [phone]

## 2011-04-28 NOTE — Consult Note (Signed)
NAMENEIL, BRICKELL NO.:  [id number]   MEDICAL RECORD NO.:  [id number]          PATIENT TYPE:  INP   LOCATION:  A319                          FACILITY:  [facility]   PHYSICIAN:  [name], M.D. 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  He states  that approximately a month ago, he had some obstipation and followed by  some change in his bowel habits.  No bright red rectal bleeding or black  tarry stool was noted; but however, he had change in caliber of his  stools with increasing constipation.  Later, he presented here with  bloating, constipation and nausea and vomiting.  He had a CT scan which  showed an obstructing lesion.  GI service noted an obstructing apple  core lesion in the sigmoid colon.  Surgery was consulted and responded  immediately.   PHYSICAL EXAMINATION:  GENERAL:  Discloses a pleasant 50 year old white  male in no acute distress.  VITAL SIGNS:  His temperature is 97.1, pulses was 119, respirations 20,  blood pressure 120/88.  He is 5 feet 10 and weighs 179 pounds.  Physical examination as follows:  HEENT:  Head is normocephalic.  EYES:  Extraocular movements are intact.  Pupils were round and react to light and accommodation.  There is no  conjunctive pallor or scleral injection.  Sclerae has a normal tincture.  Nose and oral mucosa are moist.  The patient has an NG tube placed.  NECK:  Supple and cylindrical without jugular vein distension,  thyromegaly or tracheal deviation.  I appreciate no cervical adenopathy.  CHEST:  Clear, both to anterior and posterior auscultation.  HEART:  Regular rhythm.  ABDOMEN:  Somewhat bloated.  Bowel sounds are diminished.  He has no  femoral or inguinal hernias.  Genitalia is within normal limits.  RECTAL:  Examination is deferred, as this patient has just had a  colonoscopy.  EXTREMITIES:  Within normal limits.  SKIN/INTEGUMENT:  The patient has a birth mark on his lower abdomen.   PAST SURGICAL HISTORY:  Include a carpal tunnel syndrome and surgery for  a broken distal right arm in the past.  He has had no internal fixation  prosthesis placed.  He also has had a previous appendectomy.   REVIEW OF SYSTEMS:  GI system.  No family history of colon cancer.  No  past history of hepatitis.   PAST HISTORY:  As mentioned above of obstipation, bloating, nausea and  vomiting and the presentation of an obstructing lesion.  He has had no  bright red rectal bleeding or black tarry stools grossly noted, and he  had this colonoscopy with the above results done today.  GU system: No  history of nephrolithiasis or dysuria.  ENDOCRINE SYSTEM:  No history of  diabetes or thyroid disease.  CARDIORESPIRATORY SYSTEM:  The patient as  no chest pain.  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  He does smoke a pack and a half of cigarettes per day.  He takes no medications on a regular basis other than aspirin.   ALLERGIES:  HE IS ALLERGIC TO CODEINE.   IMPRESSION:  Therefore, Jerran Tappan is a 50 year old white male  who presented with obstructing sigmoid lesion.  He will need to have  this removed.  We will plan to do an end colostomy on him, as I am  unable to prepare his left colon, and this is a safer approach as  explained him in detail.  I explained to him the need for a colostomy.  I explained to him the increased incidence of infection in this scenario  and bleeding as another complication.  He will also see the oncologist  postoperatively, and all of this was explained in detail to him, and all  questions were answered.  We will proceed with surgery today, as things  are not likely to change over the weekend.  He is to continue his NG  suction.  We  will make sure that he is well-hydrated and will initiate  parenteral antibiotics.  Informed consent was obtained.      [name], M.D.  Electronically Signed     WB/MEDQ  D:  07/08/2007  T:  07/08/2007  Job:  [id number]   cc:   [name], M.D.  Fax: [phone]   R. Roetta Sessions, M.D.  [address]  [place]  [place] [address]   Hospitalist   Ladona Horns. Mariel Sleet, MD  Fax: [phone]

## 2011-04-28 NOTE — Consult Note (Signed)
NAMEPROSPER, PAFF            ACCOUNT NO.:  [id number]   MEDICAL RECORD NO.:  [id number]          PATIENT TYPE:  INP   LOCATION:  A319                          FACILITY:  [facility]   PHYSICIAN:  R. Roetta Sessions, M.D. DATE OF BIRTH:  13-May-1961   DATE OF CONSULTATION:  07/07/2007  DATE OF DISCHARGE:                                 CONSULTATION   REASON FOR CONSULTATION:  Sigmoid colon obstruction.   HISTORY OF PRESENT ILLNESS:  The patient is a 50 year old Caucasian  gentleman with a 3-day history of abdominal pain, nausea and vomiting.  Back in June he developed constipation, did not have a bowel movement  for over 10 days.  He tried laxatives at the time and has used them  intermittently since then.  He developed abdominal pain and went to  Urgent Care this week.  He was given laxatives as well but did not have  a bowel movement.  He now presents with a distended abdomen, vomiting.  He denies any melena or rectal bleeding.  No family history of colon  cancer or prior colonoscopy.  He has a white count of 19,500.  His  hemoglobin is 18, BUN 18, creatinine 1.29, LFTs are normal.  He had a CT  of the abdomen and pelvis with a minimally prominent CBD and pancreatic  duct.  He had prominence of the small bowel and evidence of a partial  distal colonic obstruction.  He had an area of narrowing of the sigmoid  colon and a suspicion of a constricting lesion.   MEDICATIONS AT HOME:  1. Aspirin 81 mg daily.  2. FiberSource two tablets daily.   ALLERGIES:  CODEINE CAUSES NAUSEA AND VOMITING.   PAST MEDICAL HISTORY:  1. Appendectomy 1982.  2. Carpal tunnel release on the right.   FAMILY HISTORY:  Negative for:  1. Colorectal cancer.  2. Chronic GI illnesses.  3. Liver disease.   SOCIAL HISTORY:  1. He is married.  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  3. He smokes 1 pack of cigarettes daily.  4. No alcohol use.  5. He has 1 daughter.   REVIEW OF SYSTEMS:  See HPI for GI.  CONSTITUTIONAL:   Denies any weight  loss.  CARDIOPULMONARY:  No chest pain or shortness of breath.   PHYSICAL EXAM:  Temp 98, pulse 71, respirations 20, blood pressure  110/70.  GENERAL:  A pleasant, well-nourished, well-developed Caucasian male in  no acute distress.  Skin warm and dry, no jaundice.  HEENT:  Sclera nonicteric.  Oropharyngeal mucosa moist and pink.  No  lesions, erythema or exudate.  No lymphadenopathy.  CHEST:  Lungs are clear to auscultation.  CARDIAC EXAM:  Reveals regular rate and rhythm.  No murmurs, rubs or  gallops.  ABDOMEN:  Positive bowel sounds.  Abdomen is distended and full.  He has  diffuse tenderness in the mid abdomen region.  No hepatosplenomegaly or  masses appreciated.  EXTREMITIES:  No edema.   LABS:  As above.  In addition, platelet count is 311,000, sodium 139,  potassium 3.8, total bilirubin 0.9, alkaline phosphatase 105, AST 17,  ALT  16, albumin 4.8, lipase 12.   IMPRESSION:  The patient is a 50 year old gentleman with colonic  obstruction secondary to a mass which is very worrisome for colon  carcinoma.   RECOMMENDATION:  1. Flexible sigmoidoscopy in the morning, will hold Lovenox for now.  2. Recheck CBC in the morning.      Tana Coast, P.AJonathon Bellows, M.D.  Electronically Signed    LL/MEDQ  D:  07/07/2007  T:  07/07/2007  Job:  [id number]   cc:   [employer] [facility] [employer]

## 2011-04-28 NOTE — H&P (Signed)
NAMELADARIUS, [name]            ACCOUNT NO.:  [id number]   MEDICAL RECORD NO.:  [id number]          PATIENT TYPE:  AMB   LOCATION:  DAY                           FACILITY:  [facility]   PHYSICIAN:  Kassie Mends, M.D.      DATE OF BIRTH:  [birth year]   DATE OF ADMISSION:  DATE OF DISCHARGE:  LH                              HISTORY & PHYSICAL   CHIEF COMPLAINT:  Time for colonoscopy, history of colon cancer.   PHYSICIAN SIGNING NOTE:  Kassie Mends, MD in the place of Dr. Kendell Bane who  is absent.   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  He initially  had a Hartmann procedure.  He has since had his colostomy taken down.  His last colonoscopy was prior to his takedown on January 11, 2008.  The  colonic mucosa from the colostomy to the cecum appeared to be normal.  The patient has been doing well.  He states his last CEA level was 1.2.  This was a couple of days ago.  He has had no problems with his bowel  movements.  No abdominal pain, nausea, vomiting, weight loss, heartburn  dysphagia, odynophagia, melena, or rectal bleeding.  He states he is  doing well.  He is here to schedule his surveillance colonoscopy.  His  only complaint is peripheral neuropathy, which he has been told may be a  side effect from his chemotherapy.   CURRENT MEDICATIONS:  1. Lyrica twice a day.  2. Multivitamin daily.   ALLERGIES:  CODEINE.   PAST MEDICAL HISTORY:  History of colon cancer as outlined above,  appendectomy, and carpal tunnel release on the right.   FAMILY HISTORY:  Negative for colorectal cancer, chronic GI illnesses,  or liver disease.   SOCIAL HISTORY:  He is married.  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  He quit smoking last August.  No alcohol use.  He has a  daughter.   REVIEW OF SYSTEMS:  See HPI for GI.  CONSTITUTIONAL:   No weight loss.  CARDIOPULMONARY:  No chest pain, shortness of breath.   PHYSICAL EXAMINATION:  Weight 189, height 5 feet 10, temp 97.8, blood  pressure 108/68, and pulse 72.  GENERAL:  Pleasant well-nourished, well-developed Caucasian male in no  acute distress.  SKIN:  Warm and dry.  No jaundice.  HEENT: Sclerae nonicteric.  Oropharyngeal mucosa moist and pink.  No  lesions, erythema, or exudate.  No lymphadenopathy or thyromegaly.  CHEST:  Lungs clear to auscultation.  CARDIAC:  Regular rate and rhythm.  Normal S1 and S2.  No murmurs, rubs,  or gallops.  ABDOMEN:  Positive bowel sounds.  Abdomen soft, nontender, nondistended.  No organomegaly or masses.  No rebound or guarding.  No abdominal bruits  or hernias.  LOWER EXTREMITIES:  No edema.   IMPRESSION:  Mr. Kalas is a 50 year old gentleman with history of  stage III adenocarcinoma of the sigmoid colon status post resection and  chemotherapy.  He is now nearly 1 year out from his diagnosis.  He is  due for a colonoscopy.   PLAN:  Colonoscopy in the near future with Dr. Kendell Bane.  He was asked to  return to Dr. Cira Servant who was coursed on Dr. Aura Fey absence.      Tana Coast, P.A.      Kassie Mends, M.D.  Electronically Signed    LL/MEDQ  D:  06/12/2008  T:  06/13/2008  Job:  [id number]   cc:   Ladona Horns. Mariel Sleet, MD  Fax: [phone]

## 2011-04-28 NOTE — Op Note (Signed)
NAMEAURYN, Valdez Valdez NO.:  [id number]   MEDICAL RECORD NO.:  [id number]          PATIENT TYPE:  INP   LOCATION:  A319                          FACILITY:  [facility]   PHYSICIAN:  [name], M.D. DATE OF BIRTH:  Sep 30, 1961   DATE OF PROCEDURE:  07/08/2007  DATE OF DISCHARGE:                               OPERATIVE REPORT   SURGEON:  [name], M.D.   PREOPERATIVE DIAGNOSIS:  Obstructing sigmoid cancer.   POSTOPERATIVE DIAGNOSIS:  Obstructing sigmoid cancer.   PROCEDURE:  Sigmoid resection.   NOTE:  This is a 50 year old white male who came into the emergency room  was admitted to the GI service and the hospitalist service with  obstructing sigmoid tumor.  He underwent flexible sigmoidoscopy today by  the GI service and biopsies were obtained and in essence, a completely  obstructing sigmoid colon was encountered approximately 30 cm from the  anal verge.  Surgery was consulted.  We discussed the need for surgery,  discussing that we could not do a primary anastomosis in the presence of  unprepared and obstructed colon, it would not be safe. We discussed  colostomy with him in detail and we discussed the surgery in detail.  All questions were answered.   GROSS OPERATIVE FINDINGS:  The patient had a massively dilated sigmoid  colon proximal to the obstruction and dilated loops of small bowel, as  well.  We did a closed stapled anastomosis, essentially a sleeve  resection around the sigmoid colon in order to remove it without any  spillage.  This was done without any problems.  There were no obvious  liver metastasis or other lesions palpated.   SPECIMENS:  Sigmoid colon.   Note, we had requested a frozen section, we were told there was not  courier service and a frozen section was not available.  After the  procedure, I called Dr. Nedra Hai and asked if it would be alright to send the  specimen in formalin as a closed stapled specimen and she said that  would be acceptable.   TECHNIQUE:  The patient was placed in the supine position. After the  adequate administration of general anesthesia via endotracheal  intubation, his entire abdomen was prepped with Betadine solution and  draped in the usual manner.  A midline incision was carried out just  above the umbilicus to the symphysis pubis, opening the abdomen in the  midline carefully to avoid any puncture of any dilated loops of bowel.  There was an obvious obstructed area in the distal sigmoid. This was  adhesed slightly to the parietal peritoneum.  There appeared to be no  other implants or any spread of tumor outside. There was some yellowish  ascitic fluid within the abdomen, likely just serous exudate from the  obstructive process. The colon was palpated.  There were no obvious  lesions and no other lesions encountered intraoperatively.   We then dissected the colon taking down the colon from the white line of  Toldt along the lateral border and then placed a 70 cm GIA stapler  distal and proximal and then removed, in a sleeve  resection, the  obstructed portion of the colon.  I then irrigated the abdomen and then  placed the proximal portion of the transcended colon which remained  staple through an incision made in the lateral aspect of the abdomen.  After checking for hemostasis and irrigating, we then closed the fascia  and peritoneum with through-and-through 0 Prolene sutures.  We irrigated  the subcu and closed the skin with stapling device. I isolated the skin  incision with a large OpSite dressing and then matured the colostomy  with 4-0 chromic.  A colostomy device was then fitted over this.  Prior  to closure, all sponge, needle, and instrument counts were found to be  correct.  Estimated blood loss was minimal, perhaps 100 mL.  The patient  received 2700 mL crystalloids intraoperatively.  The patient was taken  to the recovery room in satisfactory condition.       [name], M.D.  Electronically Signed     WB/MEDQ  D:  07/08/2007  T:  07/09/2007  Job:  [id number]   cc:   R. Roetta Sessions, M.D.  [address]  [place]  [place] [address]   Dr. Nedra Hai

## 2011-06-02 ENCOUNTER — Encounter (HOSPITAL_COMMUNITY): Payer: Self-pay | Admitting: Oncology

## 2011-06-02 ENCOUNTER — Other Ambulatory Visit (HOSPITAL_COMMUNITY): Payer: Self-pay | Admitting: Oncology

## 2011-06-02 DIAGNOSIS — C187 Malignant neoplasm of sigmoid colon: Secondary | ICD-10-CM | POA: Insufficient documentation

## 2011-06-26 ENCOUNTER — Encounter (HOSPITAL_COMMUNITY): Payer: Federal, State, Local not specified - PPO | Attending: Oncology | Admitting: Oncology

## 2011-06-26 DIAGNOSIS — C189 Malignant neoplasm of colon, unspecified: Secondary | ICD-10-CM

## 2011-06-26 DIAGNOSIS — G629 Polyneuropathy, unspecified: Secondary | ICD-10-CM

## 2011-06-26 DIAGNOSIS — G609 Hereditary and idiopathic neuropathy, unspecified: Secondary | ICD-10-CM | POA: Insufficient documentation

## 2011-06-26 LAB — DIFFERENTIAL
Eosinophils Absolute: 0.2 10*3/uL (ref 0.0–0.7)
Lymphocytes Relative: 28 % (ref 12–46)
Lymphs Abs: 2.7 10*3/uL (ref 0.7–4.0)
Neutro Abs: 6.3 10*3/uL (ref 1.7–7.7)
Neutrophils Relative %: 64 % (ref 43–77)

## 2011-06-26 LAB — CBC
Platelets: 206 10*3/uL (ref 150–400)
RBC: 5.47 MIL/uL (ref 4.22–5.81)
WBC: 9.9 10*3/uL (ref 4.0–10.5)

## 2011-06-26 LAB — COMPREHENSIVE METABOLIC PANEL
ALT: 23 U/L (ref 0–53)
Alkaline Phosphatase: 82 U/L (ref 39–117)
CO2: 31 mEq/L (ref 19–32)
GFR calc Af Amer: >60 mL/min (ref >60)
Glucose, Bld: 96 mg/dL (ref 70–99)
Potassium: 4.1 mEq/L (ref 3.5–5.1)
Sodium: 141 mEq/L (ref 135–145)
Total Protein: 7.3 g/dL (ref 6.0–8.3)

## 2011-06-26 NOTE — Progress Notes (Signed)
This office note has been dictated.

## 2011-06-26 NOTE — Progress Notes (Signed)
CC:   [name], M.D. [name], MD FACP [facility]  DIAGNOSES: 1. Stage III adenocarcinoma of the sigmoid colon with resection in     July 2008, at which time he had 6 of 22 positive nodes.  He also     had LVI, and he received postoperative adjuvant chemotherapy with     oxaliplatin and capecitabine for 6 cycles, and has thus far     remained disease free. 2. Chronic obstruction pulmonary disease, having quit smoking in 2008     after smoking 1-2 packs a day for 30 years. 3. Carpal tunnel syndrome. 4. Umbilical hernia that is asymptomatic. 5. Hypokalemia. 6. Peripheral neuropathy now in his feet with numbness and tingling of     both feet.  His hands right now are free of issues.  Dia is not to be deployed again that he is aware of.  He brought in some pictures that he showed me of his visit to [place], and course are very impressive.  His only complaint on review of systems is numbness and tingling in his feet, and he thinks they are getting worse.  He of course had this in the past.  We treated him with both Lyrica and amitriptyline, and he thought the Lyrica worked better but he was able to get off of it because he was feeling so good he gained weight.  He is not aware of any change in his bowel movements.  No shortness of breath.  No chest pain.  No nausea, no vomiting.  No knots anywhere.  His exam shows his vital signs are stable, except his weight is up about 3 pounds, blood pressure is 107/72, pulse right around 72 and regular, respirations 18 and unlabored, temperature is normal.  Supine his pulses only 60 but also very regular.  He has no obvious thyromegaly.  He has no obvious lymphadenopathy.  His lungs are clear.  His heart shows regular rhythm and rate without murmur, rub or gallop.  His lungs do show diminished breath sounds but they are clear.  His abdomen is soft, nontender, without organomegaly.  The umbilical hernia is noted but without  change.  He has normal bowel sounds.  No masses.  No distension. He has no peripheral edema.  We will see him back in 6 months.  We will get labs today consisting of a CBC with diff, CMET, CEA, B12 and folic acid level, and in 6 months he will be due for a CT chest, abdomen and pelvis for followup of his cancer of the colon, and of course his longstanding smoking history.  I think it is worth looking at the CT chest as well.  I gave him a prescription for Lyrica 75 mg b.i.d.  He will let us know how it is working.  He will have a CEA also in 3 months.    ______________________________ Ladona Horns. Mariel Sleet, MD ESN/MEDQ  D:  06/26/2011  T:  06/26/2011  Job:  [id number]

## 2011-06-26 NOTE — Patient Instructions (Signed)
[facility]  Discharge Instructions  RECOMMENDATIONS MADE BY THE CONSULTANT AND ANY TEST RESULTS WILL BE SENT TO YOUR REFERRING DOCTOR.   EXAM FINDINGS BY MD TODAY AND SIGNS AND SYMPTOMS TO REPORT TO CLINIC OR PRIMARY MD: Exam good, we will check labs today  MEDICATIONS PRESCRIBED: Discussed meds for neuropathy   INSTRUCTIONS GIVEN AND DISCUSSED: SPECIAL INSTRUCTIONS/FOLLOW-UP: Lab work Needed  cea in 3 months CT scans in January   I acknowledge that I have been informed and understand all the instructions given to me and received a copy. I do not have any more questions at this time, but understand that I may call the [facility]ty Clinic at [facility] at [phone] during business hours should I have any further questions or need assistance in obtaining follow-up care.    __________________________________________  _____________  __________ Signature of Patient or Authorized Representative            Date                   Time    __________________________________________ Nurse's Signature

## 2011-09-03 LAB — BASIC METABOLIC PANEL
BUN: 7
BUN: 8
BUN: 9
CO2: 28
Calcium: 8 — ABNORMAL LOW
Calcium: 8.2 — ABNORMAL LOW
Chloride: 109
Creatinine, Ser: 0.79
GFR calc non Af Amer: >60
Glucose, Bld: 109 — ABNORMAL HIGH
Potassium: 3.5
Potassium: 4
Sodium: 135

## 2011-09-03 LAB — CBC
HCT: 34.5 — ABNORMAL LOW
HCT: 43.6
Hemoglobin: 15
MCHC: 35
MCV: 100.2 — ABNORMAL HIGH
MCV: 99.5
Platelets: 112 — ABNORMAL LOW
Platelets: 74 — ABNORMAL LOW
RBC: 4.35
RDW: 20.1 — ABNORMAL HIGH
RDW: 21.4 — ABNORMAL HIGH
WBC: 5.7
WBC: 5.9

## 2011-09-03 LAB — DIFFERENTIAL
Basophils Absolute: 0
Basophils Absolute: 0.1
Basophils Relative: 1
Eosinophils Absolute: 0.1
Eosinophils Absolute: 0.3
Eosinophils Absolute: 0.6
Eosinophils Relative: 11 — ABNORMAL HIGH
Lymphocytes Relative: 20
Lymphs Abs: 2
Monocytes Absolute: 0.9
Monocytes Absolute: 1
Monocytes Relative: 13 — ABNORMAL HIGH
Monocytes Relative: 17 — ABNORMAL HIGH
Neutro Abs: 3.5
Neutro Abs: 4.7
Neutrophils Relative %: 61
Neutrophils Relative %: 72

## 2011-09-03 LAB — POTASSIUM: Potassium: 4.1

## 2011-09-04 LAB — BASIC METABOLIC PANEL
Calcium: 7.9 — ABNORMAL LOW
GFR calc Af Amer: >60
GFR calc non Af Amer: >60
Glucose, Bld: 84
Potassium: 3.9
Sodium: 134 — ABNORMAL LOW

## 2011-09-04 LAB — DIFFERENTIAL
Basophils Absolute: 0.1
Eosinophils Relative: 5
Lymphocytes Relative: 19
Lymphs Abs: 1.4
Monocytes Absolute: 0.8
Monocytes Relative: 11
Neutro Abs: 4.8

## 2011-09-04 LAB — CBC
HCT: 38.2 — ABNORMAL LOW
Hemoglobin: 13.1
RBC: 3.82 — ABNORMAL LOW
RDW: 20.2 — ABNORMAL HIGH
WBC: 7.5

## 2011-09-10 LAB — CBC
HCT: 45.5
Hemoglobin: 15.8
MCHC: 34.7
MCV: 82.7
RBC: 5.5

## 2011-09-10 LAB — COMPREHENSIVE METABOLIC PANEL
AST: 32
BUN: 16
CO2: 30
Chloride: 105
Creatinine, Ser: 0.9
GFR calc non Af Amer: >60
Glucose, Bld: 79
Total Bilirubin: 0.6

## 2011-09-10 LAB — DIFFERENTIAL
Basophils Absolute: 0.1
Eosinophils Relative: 5
Lymphocytes Relative: 36
Neutro Abs: 3.2

## 2011-09-14 LAB — CEA: CEA: 1.2

## 2011-09-18 LAB — COMPREHENSIVE METABOLIC PANEL
ALT: 39 U/L (ref 0–53)
AST: 29 U/L (ref 0–37)
Albumin: 3.9 g/dL (ref 3.5–5.2)
CO2: 27 mEq/L (ref 19–32)
Calcium: 9.4 mg/dL (ref 8.4–10.5)
GFR calc Af Amer: >60 mL/min (ref >60)
Sodium: 139 mEq/L (ref 135–145)
Total Protein: 6.8 g/dL (ref 6.0–8.3)

## 2011-09-18 LAB — DIFFERENTIAL
Basophils Absolute: 0
Basophils Relative: 0 % (ref 0–1)
Basophils Relative: 1
Eosinophils Relative: 6 % — ABNORMAL HIGH (ref 0–5)
Eosinophils Relative: 10 — ABNORMAL HIGH
Lymphocytes Relative: 41
Lymphs Abs: 2.5 10*3/uL (ref 0.7–4.0)
Monocytes Absolute: 0.8
Monocytes Relative: 7 % (ref 3–12)
Monocytes Relative: 15 — ABNORMAL HIGH
Neutro Abs: 3.6 10*3/uL (ref 1.7–7.7)

## 2011-09-18 LAB — CBC
HCT: 41.3
Hemoglobin: 15.5 g/dL (ref 13.0–17.0)
Hemoglobin: 14
MCV: 96.5
Platelets: 137 — ABNORMAL LOW
RDW: 13.8 % (ref 11.5–15.5)
RDW: 26.2 — ABNORMAL HIGH

## 2011-09-18 LAB — BASIC METABOLIC PANEL
BUN: 9
Chloride: 109
Glucose, Bld: 94
Potassium: 2.9 — ABNORMAL LOW

## 2011-09-21 LAB — DIFFERENTIAL
Basophils Relative: 1
Eosinophils Relative: 15 — ABNORMAL HIGH
Lymphocytes Relative: 36
Monocytes Relative: 15 — ABNORMAL HIGH
Neutrophils Relative %: 33 — ABNORMAL LOW

## 2011-09-21 LAB — COMPREHENSIVE METABOLIC PANEL
Albumin: 3.1 — ABNORMAL LOW
Alkaline Phosphatase: 111
BUN: 10
Calcium: 8.8
Glucose, Bld: 120 — ABNORMAL HIGH
Potassium: 3.1 — ABNORMAL LOW
Total Protein: 5.8 — ABNORMAL LOW

## 2011-09-21 LAB — CBC
HCT: 42.5
MCHC: 33.4
Platelets: 142 — ABNORMAL LOW
RDW: 25.2 — ABNORMAL HIGH

## 2011-09-21 LAB — MAGNESIUM: Magnesium: 1.4 — ABNORMAL LOW

## 2011-09-22 LAB — DIFFERENTIAL
Basophils Relative: 1
Eosinophils Absolute: 0.6
Eosinophils Relative: 10 — ABNORMAL HIGH
Lymphs Abs: 2.2
Monocytes Absolute: 0.7
Neutrophils Relative %: 40 — ABNORMAL LOW

## 2011-09-22 LAB — CBC
HCT: 40.5
Hemoglobin: 13.6
MCV: 88.7
Platelets: 147 — ABNORMAL LOW
RBC: 4.57
WBC: 6

## 2011-09-22 LAB — COMPREHENSIVE METABOLIC PANEL
ALT: 61 — ABNORMAL HIGH
Alkaline Phosphatase: 110
BUN: 4 — ABNORMAL LOW
Chloride: 105
Glucose, Bld: 101 — ABNORMAL HIGH
Potassium: 3 — ABNORMAL LOW
Sodium: 140
Total Bilirubin: 0.5
Total Protein: 5.2 — ABNORMAL LOW

## 2011-09-22 LAB — POTASSIUM: Potassium: 4

## 2011-09-23 ENCOUNTER — Encounter (HOSPITAL_COMMUNITY): Payer: Federal, State, Local not specified - PPO | Attending: Oncology

## 2011-09-23 DIAGNOSIS — C189 Malignant neoplasm of colon, unspecified: Secondary | ICD-10-CM

## 2011-09-23 LAB — DIFFERENTIAL
Basophils Absolute: 0.1
Basophils Relative: 1
Lymphocytes Relative: 32
Monocytes Relative: 14 — ABNORMAL HIGH
Neutro Abs: 3.7

## 2011-09-23 LAB — CBC
Hemoglobin: 14.6
RBC: 5.06

## 2011-09-23 LAB — BASIC METABOLIC PANEL
BUN: 16
Chloride: 111
Creatinine, Ser: 0.78
Glucose, Bld: 101 — ABNORMAL HIGH
Potassium: 3.4 — ABNORMAL LOW

## 2011-09-23 NOTE — Progress Notes (Signed)
Labs drawn today for cea 

## 2011-09-24 LAB — DIFFERENTIAL
Eosinophils Absolute: 0.4
Eosinophils Relative: 7 — ABNORMAL HIGH
Lymphs Abs: 2.6
Monocytes Absolute: 0.8 — ABNORMAL HIGH

## 2011-09-24 LAB — CBC
HCT: 41.8
Hemoglobin: 14.3
MCV: 83.6
Platelets: 275
WBC: 6

## 2011-09-24 LAB — BASIC METABOLIC PANEL
BUN: 11
Chloride: 106
Glucose, Bld: 100 — ABNORMAL HIGH
Potassium: 3.2 — ABNORMAL LOW
Sodium: 138

## 2011-09-24 LAB — MAGNESIUM: Magnesium: 2

## 2011-09-25 ENCOUNTER — Other Ambulatory Visit (HOSPITAL_COMMUNITY): Payer: Federal, State, Local not specified - PPO

## 2011-09-28 LAB — DIFFERENTIAL
Basophils Absolute: 0
Basophils Relative: 0
Basophils Relative: 0
Basophils Relative: 0
Basophils Relative: 2 — ABNORMAL HIGH
Eosinophils Absolute: 0
Eosinophils Absolute: 0.4
Eosinophils Relative: 0
Eosinophils Relative: 2
Eosinophils Relative: 4
Lymphocytes Relative: 13
Lymphocytes Relative: 18
Lymphs Abs: 0.7
Lymphs Abs: 1.5
Lymphs Abs: 1.9
Monocytes Absolute: 0.1 — ABNORMAL LOW
Monocytes Absolute: 0.1 — ABNORMAL LOW
Monocytes Absolute: 1.2 — ABNORMAL HIGH
Monocytes Relative: 0 — ABNORMAL LOW
Monocytes Relative: 1 — ABNORMAL LOW
Monocytes Relative: 10
Monocytes Relative: 5
Monocytes Relative: 9
Neutro Abs: 16.8 — ABNORMAL HIGH
Neutro Abs: 5.6
Neutro Abs: 7
Neutro Abs: 8.2 — ABNORMAL HIGH
Neutrophils Relative %: 63
Neutrophils Relative %: 78 — ABNORMAL HIGH
Neutrophils Relative %: 86 — ABNORMAL HIGH

## 2011-09-28 LAB — CBC
HCT: 41.3
HCT: 41.4
HCT: 54.8 — ABNORMAL HIGH
Hemoglobin: 13.8
Hemoglobin: 15.3
Hemoglobin: 17.2 — ABNORMAL HIGH
Hemoglobin: 18 — ABNORMAL HIGH
MCHC: 32.8
MCHC: 33.3
MCHC: 33.3
MCV: 84.5
MCV: 84.5
MCV: 85.4
MCV: 85.4
Platelets: 311
Platelets: 321
RBC: 4.9
RBC: 5.5
RDW: 14.1 — ABNORMAL HIGH
RDW: 14.2 — ABNORMAL HIGH
RDW: 14.4 — ABNORMAL HIGH
RDW: 14.6 — ABNORMAL HIGH
WBC: 8.9
WBC: 9.1

## 2011-09-28 LAB — BASIC METABOLIC PANEL
BUN: 10
CO2: 26
Calcium: 7.5 — ABNORMAL LOW
Calcium: 8.3 — ABNORMAL LOW
Chloride: 105
Creatinine, Ser: 0.99
Creatinine, Ser: 1
GFR calc Af Amer: >60
GFR calc Af Amer: >60
GFR calc non Af Amer: >60
GFR calc non Af Amer: >60
Glucose, Bld: 106 — ABNORMAL HIGH
Glucose, Bld: 98
Potassium: 4.4
Potassium: 4.6
Sodium: 135
Sodium: 140

## 2011-09-28 LAB — URINALYSIS, ROUTINE W REFLEX MICROSCOPIC
Glucose, UA: NEGATIVE
Hgb urine dipstick: NEGATIVE
Ketones, ur: 15 — AB
Leukocytes, UA: NEGATIVE
Protein, ur: 30 — AB
pH: 5.5

## 2011-09-28 LAB — PREPARE RBC (CROSSMATCH)

## 2011-09-28 LAB — COMPREHENSIVE METABOLIC PANEL
ALT: 14
AST: 16
Albumin: 3.8
Albumin: 4.8
Alkaline Phosphatase: 88
BUN: 18
Calcium: 8.8
Calcium: 9.8
Creatinine, Ser: 1.29
GFR calc Af Amer: >60
Glucose, Bld: 136 — ABNORMAL HIGH
Potassium: 4.5
Sodium: 143
Total Protein: 6.7
Total Protein: 8.3

## 2011-09-28 LAB — HEPATIC FUNCTION PANEL
AST: 19
Albumin: 4.7
Alkaline Phosphatase: 104
Total Bilirubin: 0.8
Total Protein: 8.4 — ABNORMAL HIGH

## 2011-09-28 LAB — TYPE AND SCREEN: ABO/RH(D): O POS

## 2011-09-28 LAB — URINE MICROSCOPIC-ADD ON

## 2011-12-28 ENCOUNTER — Ambulatory Visit (HOSPITAL_COMMUNITY): Payer: Federal, State, Local not specified - PPO | Admitting: Oncology

## 2011-12-29 ENCOUNTER — Ambulatory Visit (HOSPITAL_COMMUNITY)
Admission: RE | Admit: 2011-12-29 | Discharge: 2011-12-29 | Disposition: A | Discharge status: Home / Self Care | Payer: Federal, State, Local not specified - PPO | Source: Ambulatory Visit | Attending: Oncology | Admitting: Oncology

## 2011-12-29 DIAGNOSIS — C189 Malignant neoplasm of colon, unspecified: Secondary | ICD-10-CM | POA: Insufficient documentation

## 2011-12-29 DIAGNOSIS — G629 Polyneuropathy, unspecified: Secondary | ICD-10-CM

## 2011-12-29 DIAGNOSIS — Z9221 Personal history of antineoplastic chemotherapy: Secondary | ICD-10-CM | POA: Insufficient documentation

## 2011-12-29 MED ORDER — IOHEXOL 300 MG/ML  SOLN
100.0000 mL | Freq: Once | INTRAMUSCULAR | Status: AC | PRN
  Administered 2011-12-29: 100 mL via INTRAVENOUS

## 2011-12-29 NOTE — Progress Notes (Signed)
Pt called clinic to ask for CT results, pleased with results. Scheduled to see MD tomorrow

## 2011-12-30 ENCOUNTER — Encounter (HOSPITAL_COMMUNITY): Payer: Self-pay | Admitting: Oncology

## 2011-12-30 ENCOUNTER — Encounter (HOSPITAL_COMMUNITY): Payer: Federal, State, Local not specified - PPO | Attending: Oncology | Admitting: Oncology

## 2011-12-30 VITALS — BP 107/66 | HR 57 | Temp 97.3°F | Wt 197.0 lb

## 2011-12-30 DIAGNOSIS — K429 Umbilical hernia without obstruction or gangrene: Secondary | ICD-10-CM

## 2011-12-30 DIAGNOSIS — J449 Chronic obstructive pulmonary disease, unspecified: Secondary | ICD-10-CM

## 2011-12-30 DIAGNOSIS — C187 Malignant neoplasm of sigmoid colon: Secondary | ICD-10-CM | POA: Insufficient documentation

## 2011-12-30 DIAGNOSIS — E876 Hypokalemia: Secondary | ICD-10-CM

## 2011-12-30 LAB — COMPREHENSIVE METABOLIC PANEL
AST: 22 U/L (ref 0–37)
Alkaline Phosphatase: 89 U/L (ref 39–117)
CO2: 31 mEq/L (ref 19–32)
Chloride: 105 mEq/L (ref 96–112)
Creatinine, Ser: 0.97 mg/dL (ref 0.50–1.35)
GFR calc non Af Amer: >90 mL/min (ref >90)
Total Bilirubin: 0.3 mg/dL (ref 0.3–1.2)

## 2011-12-30 LAB — DIFFERENTIAL
Basophils Absolute: 0.1 10*3/uL (ref 0.0–0.1)
Basophils Relative: 1 % (ref 0–1)
Eosinophils Absolute: 0.3 10*3/uL (ref 0.0–0.7)
Eosinophils Relative: 4 % (ref 0–5)
Lymphocytes Relative: 40 % (ref 12–46)
Lymphs Abs: 2.8 10*3/uL (ref 0.7–4.0)
Monocytes Absolute: 0.5 10*3/uL (ref 0.1–1.0)
Monocytes Relative: 7 % (ref 3–12)
Neutro Abs: 3.4 10*3/uL (ref 1.7–7.7)
Neutrophils Relative %: 48 % (ref 43–77)

## 2011-12-30 LAB — CBC
Hemoglobin: 15 g/dL (ref 13.0–17.0)
MCH: 28 pg (ref 26.0–34.0)
MCV: 84.1 fL (ref 78.0–100.0)
RBC: 5.35 MIL/uL (ref 4.22–5.81)

## 2011-12-30 LAB — CEA: CEA: <0.5 ng/mL (ref 0.0–5.0)

## 2011-12-30 NOTE — Progress Notes (Signed)
CC:   [name], M.D. Michael Bellows, MD FACP [facility]  DIAGNOSES: 1. Stage III adenocarcinoma of sigmoid colon with a tumor that was 4     cm in size and had 6/22 positive lymph nodes.  This was an     intermediate grade tumor.  Lymphovascular invasion was seen.  He     underwent surgery followed by chemotherapy consisting of     oxaliplatin and capecitabine for 6 cycles. 2. Chronic obstructive pulmonary disease, having quit smoking in 2008     after smoking 1-2 packs a day for 30 years. 3. Carpal tunnel syndrome. 4. Hypokalemia. 5. Umbilical hernia.  That is starting to become a little symptomatic     and I told him today that he needs to see Dr. Malvin Johns if it gets     any worse, but he has actually stopped doing sit-ups because of the     discomfort.  He also feels some discomfort when he picks up     firewood. 6. Peripheral neuropathy, which is grade 1, in the feet only.  The     hands have become free of issues in the past.  INTERVAL HISTORY:  [name] is doing great right now.  He is asymptomatic, except for this umbilical hernia discomfort.  That may need to be fixed at some point in time.  His blood work is pending, but he had a CT of the chest, abdomen, and pelvis the other day and it really shows no evidence for disease.  So, we are very fortunate that he is out 4-1/2 years with negative scans as of 12/28/2001.  PLAN:  I saw no official need to examine him today with his negative CT scans.  We will await his blood work.  We will still do CEAs every 3 months for this entire year.  We will do another CT scan of the chest, abdomen, and pelvis next year.  We will see him back in 6 months.  I will examine him then.  He is to call me if he has any problems between now and then.    ______________________________ Ladona Horns. Mariel Sleet, MD ESN/MEDQ  D:  12/30/2011  T:  12/30/2011  Job:  [id number]

## 2011-12-30 NOTE — Progress Notes (Signed)
This office note has been dictated.

## 2011-12-30 NOTE — Patient Instructions (Signed)
[name]  [id number] 21-Apr-1961   [facility]  Discharge Instructions  RECOMMENDATIONS MADE BY THE CONSULTANT AND ANY TEST RESULTS WILL BE SENT TO YOUR REFERRING DOCTOR.   EXAM FINDINGS BY MD TODAY AND SIGNS AND SYMPTOMS TO REPORT TO CLINIC OR PRIMARY MD: You are doing well.  Report any changes in bowel habits, blood in your stool, unexplained weight loss, etc.  MEDICATIONS PRESCRIBED: none     SPECIAL INSTRUCTIONS/FOLLOW-UP: Lab work Needed Today and every 3 months and Return to Clinic to see Dr. Mariel Sleet in 6 months.   I acknowledge that I have been informed and understand all the instructions given to me and received a copy. I do not have any more questions at this time, but understand that I may call the Specialty Clinic at [facility] at [phone] during business hours should I have any further questions or need assistance in obtaining follow-up care.    __________________________________________  _____________  __________ Signature of Patient or Authorized Representative            Date                   Time    __________________________________________ Nurse's Signature

## 2012-03-28 ENCOUNTER — Encounter (HOSPITAL_COMMUNITY): Payer: Federal, State, Local not specified - PPO | Attending: Oncology

## 2012-03-28 DIAGNOSIS — C187 Malignant neoplasm of sigmoid colon: Secondary | ICD-10-CM | POA: Insufficient documentation

## 2012-03-28 NOTE — Progress Notes (Signed)
Lab draw

## 2012-03-29 ENCOUNTER — Other Ambulatory Visit (HOSPITAL_COMMUNITY): Payer: Federal, State, Local not specified - PPO

## 2012-06-27 ENCOUNTER — Encounter (HOSPITAL_COMMUNITY): Payer: Federal, State, Local not specified - PPO | Attending: Oncology

## 2012-06-27 DIAGNOSIS — C187 Malignant neoplasm of sigmoid colon: Secondary | ICD-10-CM

## 2012-06-27 DIAGNOSIS — J4489 Other specified chronic obstructive pulmonary disease: Secondary | ICD-10-CM | POA: Insufficient documentation

## 2012-06-27 DIAGNOSIS — K429 Umbilical hernia without obstruction or gangrene: Secondary | ICD-10-CM | POA: Insufficient documentation

## 2012-06-27 DIAGNOSIS — T451X5A Adverse effect of antineoplastic and immunosuppressive drugs, initial encounter: Secondary | ICD-10-CM | POA: Insufficient documentation

## 2012-06-27 DIAGNOSIS — J449 Chronic obstructive pulmonary disease, unspecified: Secondary | ICD-10-CM | POA: Insufficient documentation

## 2012-06-27 DIAGNOSIS — G5600 Carpal tunnel syndrome, unspecified upper limb: Secondary | ICD-10-CM | POA: Insufficient documentation

## 2012-06-27 DIAGNOSIS — Z09 Encounter for follow-up examination after completed treatment for conditions other than malignant neoplasm: Secondary | ICD-10-CM | POA: Insufficient documentation

## 2012-06-27 DIAGNOSIS — Z85038 Personal history of other malignant neoplasm of large intestine: Secondary | ICD-10-CM | POA: Insufficient documentation

## 2012-06-27 DIAGNOSIS — G620 Drug-induced polyneuropathy: Secondary | ICD-10-CM | POA: Insufficient documentation

## 2012-06-27 NOTE — Progress Notes (Signed)
Labs drawn today for cea 

## 2012-06-28 ENCOUNTER — Encounter (HOSPITAL_BASED_OUTPATIENT_CLINIC_OR_DEPARTMENT_OTHER): Payer: Federal, State, Local not specified - PPO | Admitting: Oncology

## 2012-06-28 ENCOUNTER — Other Ambulatory Visit (HOSPITAL_COMMUNITY): Payer: Federal, State, Local not specified - PPO

## 2012-06-28 ENCOUNTER — Encounter (HOSPITAL_COMMUNITY): Payer: Self-pay | Admitting: Oncology

## 2012-06-28 VITALS — BP 105/66 | HR 56 | Temp 97.3°F | Wt 198.0 lb

## 2012-06-28 DIAGNOSIS — J449 Chronic obstructive pulmonary disease, unspecified: Secondary | ICD-10-CM

## 2012-06-28 DIAGNOSIS — C187 Malignant neoplasm of sigmoid colon: Secondary | ICD-10-CM

## 2012-06-28 DIAGNOSIS — G620 Drug-induced polyneuropathy: Secondary | ICD-10-CM

## 2012-06-28 DIAGNOSIS — R079 Chest pain, unspecified: Secondary | ICD-10-CM

## 2012-06-28 NOTE — Patient Instructions (Addendum)
[name]  [id number] 12/14/1961 Dr. Glenford Peers   [facility]  Discharge Instructions  RECOMMENDATIONS MADE BY THE CONSULTANT AND ANY TEST RESULTS WILL BE SENT TO YOUR REFERRING DOCTOR.   EXAM FINDINGS BY MD TODAY AND SIGNS AND SYMPTOMS TO REPORT TO CLINIC OR PRIMARY MD: exam and discussion per MD.  Please fax [place] the labs you had done at the [place].  Our fax number is [phone].  MEDICATIONS PRESCRIBED: none   INSTRUCTIONS GIVEN AND DISCUSSED: Other :  Report changes in bowel habits, blood in your stool, etc.  SPECIAL INSTRUCTIONS/FOLLOW-UP: Lab work Needed CEA every 3 months, Xray Studies Needed CT of Chest, Abdomen and Pelvis and Return to Clinic after scans.  Referral to Dr. Jena Gauss.    I acknowledge that I have been informed and understand all the instructions given to me and received a copy. I do not have any more questions at this time, but understand that I may call the Specialty Clinic at [facility] at [phone] during business hours should I have any further questions or need assistance in obtaining follow-up care.    __________________________________________  _____________  __________ Signature of Patient or Authorized Representative            Date                   Time    __________________________________________ Nurse's Signature

## 2012-06-28 NOTE — Progress Notes (Signed)
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 Thus far he remains disease free.  Problem #2 COPD though he quit 2008 after smoking 1-2 packs a day for 30 years.  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 We will get Dr. Jena Gauss to see him in consultation for EGD.  From the standpoint of his colon cancer he is doing well bowels are working well no blood in his stools CEA levels are normal lab work from the [place] within the last couple weeks is excellent including normal liver enzymes normal CBC but a mildly elevated triglyceride level.  Vital signs today are stable lymph nodes remain negative throughout  lungs are clear heart shows a regular rhythm and rate without murmur rub or gallop. He does have mild hyperresonance to percussion and mildly decreased breath sounds. Abdomen is soft and nontender without organomegaly. His umbilical hernia still present. He has no leg edema no arm edema he looks great he is alert and oriented. So we will continue to monitor his CEAs every 3 months for one more year. In January has a smoking history and a colon cancer history that was noted to have multiple lymph nodes we will do a CT the chest abdomen and pelvis. I will see him in 6 months after the scans then we will probably go to every  six-month followups if all is well.

## 2012-07-19 ENCOUNTER — Ambulatory Visit (INDEPENDENT_AMBULATORY_CARE_PROVIDER_SITE_OTHER): Payer: Federal, State, Local not specified - PPO | Admitting: Internal Medicine

## 2012-07-19 ENCOUNTER — Encounter: Payer: Self-pay | Admitting: Internal Medicine

## 2012-07-19 VITALS — BP 108/71 | HR 64 | Temp 98.1°F | Ht 70.0 in | Wt 204.4 lb

## 2012-07-19 DIAGNOSIS — K219 Gastro-esophageal reflux disease without esophagitis: Secondary | ICD-10-CM

## 2012-07-19 DIAGNOSIS — R143 Flatulence: Secondary | ICD-10-CM

## 2012-07-19 DIAGNOSIS — R142 Eructation: Secondary | ICD-10-CM

## 2012-07-19 DIAGNOSIS — R1310 Dysphagia, unspecified: Secondary | ICD-10-CM

## 2012-07-19 DIAGNOSIS — R079 Chest pain, unspecified: Secondary | ICD-10-CM

## 2012-07-19 NOTE — Patient Instructions (Signed)
EGD with esophageal dilation as appropriate in the near future.

## 2012-07-19 NOTE — Progress Notes (Signed)
Primary Care Physician:  LAZO,IVAN, MD Primary Gastroenterologist:  Dr. Yarieliz Wasser  Pre-Procedure History & Physical: HPI:  [name] is a 51 y.o. male here for further evaluation of  belching and chest pain. He does not have an exertional component. In fact, he routinely runs 8-12 miles multiple times weekly. Started niacin 3 weeks ago. Belching and chest pain go back a year or 2. Also, reports esophageal dysphagia to solids.  Denies typical reflux symptoms. No weight loss, early satiety nausea or vomiting. No melena or rectal bleeding. Last colonoscopy demonstrated a hyperplastic polyp in January of 2011; due for surveillance in 2016. Gallbladder remains in situ. No acid suppression therapy thus far. Long-standing tobacco history in the form of cigarettes; none since 2008. No prior evaluation is upper GI tract.  Past Medical History  Diagnosis Date  . Adenocarcinoma of sigmoid colon 2008  . Hypokalemia   . Umbilical hernia   . Carpal tunnel syndrome   . COPD (chronic obstructive pulmonary disease)     Past Surgical History  Procedure Date  . Colon resection   . Appendectomy 1982  . Carpal tunnel release   . Colonoscopy 12/30/2009    Dr. Alys Dulak- s/p segmental L colon resection with anastomosis identified at 22cm. no adenomatous change or malignancy identified.     Prior to Admission medications   Medication Sig Start Date End Date Taking? Authorizing Provider  aspirin 81 MG tablet Take 81 mg by mouth daily.   Yes Historical Provider, MD  cholecalciferol (VITAMIN D) 1000 UNITS tablet Take 1,000 Units by mouth daily.   Yes Historical Provider, MD  fish oil-omega-3 fatty acids 1000 MG capsule Take 2 g by mouth daily.   Yes Historical Provider, MD  pregabalin (LYRICA) 75 MG capsule Take 75 mg by mouth as needed.   Yes Historical Provider, MD    Allergies as of 07/19/2012 - Review Complete 07/19/2012  Allergen Reaction Noted  . Codeine Other (See Comments) 06/26/2011    Family  History  Problem Relation Age of Onset  . Emphysema Father   . Heart attack Father     History   Social History  . Marital Status: Married    Spouse Name: N/A    Number of Children: N/A  . Years of Education: N/A   Occupational History  . Not on file.   Social History Main Topics  . Smoking status: Former Smoker  . Smokeless tobacco: Never Used  . Alcohol Use: Yes  . Drug Use: No  . Sexually Active: Yes   Other Topics Concern  . Not on file   Social History Narrative  . No narrative on file    Review of Systems: See HPI, otherwise negative ROS  Physical Exam: BP 108/71  Pulse 64  Temp 98.1 F (36.7 C) (Temporal)  Ht 5' 10" (1.778 m)  Wt 204 lb 6.4 oz (92.715 kg)  BMI 29.33 kg/m2 General:   Alert,  Well-developed, well-nourished, pleasant and cooperative in NAD Skin:  Intact without significant lesions or rashes. Eyes:  Sclera clear, no icterus.   Conjunctiva pink. Ears:  Normal auditory acuity. Nose:  No deformity, discharge,  or lesions. Mouth:  No deformity or lesions. Neck:  Supple; no masses or thyromegaly. No significant cervical adenopathy. Lungs:  Clear throughout to auscultation.   No wheezes, crackles, or rhonchi. No acute distress. Heart:  Regular rate and rhythm; no murmurs, clicks, rubs,  or gallops. Abdomen: Non-distended, well-healed laparotomy scar. He has a marble-sized, easily reducible umbilical   hernia. Abdomen is soft and nontender without appreciable mass or organomegaly  Pulses:  Normal pulses noted. Extremities:  Without clubbing or edema.  Impression/Plan:   Very pleasant 51-year-old gentleman with a one to two-year history of insidiously progressing atypical chest discomfort with associated belching and solid food dysphagia. It sounds like to me the patient is having a GERD symptoms-although somewhat atypical. No exertional component. I suspect his symptoms are largely GI in origin. Esophageal dysphagia noted and somewhat worrisome given  the long-standing tobacco exposure. 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 Eosinophilic esophagitis and occult neoplasm which certainly remained in the differential. He recently started taking niacin but it does not sound like he has any new symptoms related to this medication.   Recommendations: Will go directly to diagnostic EGD. He may or may not benefit from esophageal dilation.The risks, benefits, limitations, alternatives and imponderables have been reviewed with the patient. Potential for esophageal dilation, biopsy, etc. have also been reviewed.  Questions have been answered. All parties agreeable.  We'll withhold empiric treatment for GERD until endoscopic evaluation has been performed. Further recommendations to follow. I'd like to thank Dr. Neijstrom allowing me see this nice gentleman once again. 

## 2012-07-27 ENCOUNTER — Encounter (HOSPITAL_COMMUNITY): Payer: Self-pay | Admitting: Pharmacy Technician

## 2012-08-10 MED ORDER — SODIUM CHLORIDE 0.45 % IV SOLN
Freq: Once | INTRAVENOUS | Status: AC
  Administered 2012-08-11: 07:00:00 via INTRAVENOUS

## 2012-08-11 ENCOUNTER — Encounter (HOSPITAL_COMMUNITY)
Admission: RE | Disposition: A | Discharge status: Home / Self Care | Payer: Self-pay | Source: Ambulatory Visit | Attending: Internal Medicine

## 2012-08-11 ENCOUNTER — Encounter (HOSPITAL_COMMUNITY): Payer: Self-pay | Admitting: *Deleted

## 2012-08-11 ENCOUNTER — Ambulatory Visit (HOSPITAL_COMMUNITY)
Admission: RE | Admit: 2012-08-11 | Discharge: 2012-08-11 | Disposition: A | Discharge status: Home / Self Care | Payer: Federal, State, Local not specified - PPO | Source: Ambulatory Visit | Attending: Internal Medicine | Admitting: Internal Medicine

## 2012-08-11 ENCOUNTER — Other Ambulatory Visit: Payer: Self-pay | Admitting: Internal Medicine

## 2012-08-11 DIAGNOSIS — J449 Chronic obstructive pulmonary disease, unspecified: Secondary | ICD-10-CM | POA: Insufficient documentation

## 2012-08-11 DIAGNOSIS — R1310 Dysphagia, unspecified: Secondary | ICD-10-CM

## 2012-08-11 DIAGNOSIS — K219 Gastro-esophageal reflux disease without esophagitis: Secondary | ICD-10-CM

## 2012-08-11 DIAGNOSIS — K2100 Gastro-esophageal reflux disease with esophagitis, without bleeding: Secondary | ICD-10-CM | POA: Insufficient documentation

## 2012-08-11 DIAGNOSIS — J4489 Other specified chronic obstructive pulmonary disease: Secondary | ICD-10-CM | POA: Insufficient documentation

## 2012-08-11 DIAGNOSIS — K222 Esophageal obstruction: Secondary | ICD-10-CM

## 2012-08-11 DIAGNOSIS — K2960 Other gastritis without bleeding: Secondary | ICD-10-CM

## 2012-08-11 DIAGNOSIS — Z85038 Personal history of other malignant neoplasm of large intestine: Secondary | ICD-10-CM | POA: Insufficient documentation

## 2012-08-11 DIAGNOSIS — K449 Diaphragmatic hernia without obstruction or gangrene: Secondary | ICD-10-CM | POA: Insufficient documentation

## 2012-08-11 HISTORY — PX: ESOPHAGOGASTRODUODENOSCOPY: SHX1529

## 2012-08-11 SURGERY — ESOPHAGOGASTRODUODENOSCOPY (EGD) WITH ESOPHAGEAL DILATION
Anesthesia: Moderate Sedation

## 2012-08-11 MED ORDER — MEPERIDINE HCL 100 MG/ML IJ SOLN
INTRAMUSCULAR | Status: DC | PRN
  Administered 2012-08-11: 25 mg via INTRAVENOUS
  Administered 2012-08-11: 50 mg via INTRAVENOUS

## 2012-08-11 MED ORDER — STERILE WATER FOR IRRIGATION IR SOLN
Status: DC | PRN
  Administered 2012-08-11: 08:00:00

## 2012-08-11 MED ORDER — BUTAMBEN-TETRACAINE-BENZOCAINE 2-2-14 % EX AERO
INHALATION_SPRAY | CUTANEOUS | Status: DC | PRN
  Administered 2012-08-11: 2 via TOPICAL

## 2012-08-11 MED ORDER — MIDAZOLAM HCL 5 MG/5ML IJ SOLN
INTRAMUSCULAR | Status: DC | PRN
  Administered 2012-08-11: 1 mg via INTRAVENOUS
  Administered 2012-08-11 (×2): 2 mg via INTRAVENOUS

## 2012-08-11 MED ORDER — MIDAZOLAM HCL 5 MG/5ML IJ SOLN
INTRAMUSCULAR | Status: AC
  Filled 2012-08-11: qty 10

## 2012-08-11 MED ORDER — MEPERIDINE HCL 100 MG/ML IJ SOLN
INTRAMUSCULAR | Status: AC
  Filled 2012-08-11: qty 2

## 2012-08-11 MED ORDER — DEXLANSOPRAZOLE 60 MG PO CPDR: 60.0000 mg | DELAYED_RELEASE_CAPSULE | Freq: Every day | ORAL | Status: DC

## 2012-08-11 NOTE — Interval H&P Note (Signed)
History and Physical Interval Note:  08/11/2012 7:43 AM  [name]  has presented today for surgery, with the diagnosis of Dysphagia and GERD  The various methods of treatment have been discussed with the patient and family. After consideration of risks, benefits and other options for treatment, the patient has consented to  Procedure(s) (LRB): ESOPHAGOGASTRODUODENOSCOPY (EGD) WITH ESOPHAGEAL DILATION (N/A) as a surgical intervention .  The patient's history has been reviewed, patient examined, no change in status, stable for surgery.  I have reviewed the patient's chart and labs.  Questions were answered to the patient's satisfaction.     Eula Listen

## 2012-08-11 NOTE — Progress Notes (Signed)
#  20 dexilant samples given per RMR orders.

## 2012-08-11 NOTE — H&P (View-Only) (Signed)
Primary Care Physician:  Frederica Kuster, MD Primary Gastroenterologist:  Dr. Jena Gauss  Pre-Procedure History & Physical: HPI:  [name] is a 51 y.o. male here for further evaluation of  belching and chest pain. He does not have an exertional component. In fact, he routinely runs 8-12 miles multiple times weekly. Started niacin 3 weeks ago. Belching and chest pain go back a year or 2. Also, reports esophageal dysphagia to solids.  Denies typical reflux symptoms. No weight loss, early satiety nausea or vomiting. No melena or rectal bleeding. Last colonoscopy demonstrated a hyperplastic polyp in January of 2011; due for surveillance in 2016. Gallbladder remains in situ. No acid suppression therapy thus far. Long-standing tobacco history in the form of cigarettes; none since 2008. No prior evaluation is upper GI tract.  Past Medical History  Diagnosis Date  . Adenocarcinoma of sigmoid colon 2008  . Hypokalemia   . Umbilical hernia   . Carpal tunnel syndrome   . COPD (chronic obstructive pulmonary disease)     Past Surgical History  Procedure Date  . Colon resection   . Appendectomy 1982  . Carpal tunnel release   . Colonoscopy 12/30/2009    Dr. Jena Gauss- s/p segmental L colon resection with anastomosis identified at 22cm. no adenomatous change or malignancy identified.     Prior to Admission medications   Medication Sig Start Date End Date Taking? Authorizing Provider  aspirin 81 MG tablet Take 81 mg by mouth daily.   Yes Historical Provider, MD  cholecalciferol (VITAMIN D) 1000 UNITS tablet Take 1,000 Units by mouth daily.   Yes Historical Provider, MD  fish oil-omega-3 fatty acids 1000 MG capsule Take 2 g by mouth daily.   Yes Historical Provider, MD  pregabalin (LYRICA) 75 MG capsule Take 75 mg by mouth as needed.   Yes Historical Provider, MD    Allergies as of 07/19/2012 - Review Complete 07/19/2012  Allergen Reaction Noted  . Codeine Other (See Comments) 06/26/2011    Family  History  Problem Relation Age of Onset  . Emphysema Father   . Heart attack Father     History   Social History  . Marital Status: Married    Spouse Name: N/A    Number of Children: N/A  . Years of Education: N/A   Occupational History  . Not on file.   Social History Main Topics  . Smoking status: Former Games developer  . Smokeless tobacco: Never Used  . Alcohol Use: Yes  . Drug Use: No  . Sexually Active: Yes   Other Topics Concern  . Not on file   Social History Narrative  . No narrative on file    Review of Systems: See HPI, otherwise negative ROS  Physical Exam: BP 108/71  Pulse 64  Temp 98.1 F (36.7 C) (Temporal)  Ht 5\' 10"  (1.778 m)  Wt 204 lb 6.4 oz (92.715 kg)  BMI 29.33 kg/m2 General:   Alert,  Well-developed, well-nourished, pleasant and cooperative in NAD Skin:  Intact without significant lesions or rashes. Eyes:  Sclera clear, no icterus.   Conjunctiva pink. Ears:  Normal auditory acuity. Nose:  No deformity, discharge,  or lesions. Mouth:  No deformity or lesions. Neck:  Supple; no masses or thyromegaly. No significant cervical adenopathy. Lungs:  Clear throughout to auscultation.   No wheezes, crackles, or rhonchi. No acute distress. Heart:  Regular rate and rhythm; no murmurs, clicks, rubs,  or gallops. Abdomen: Non-distended, well-healed laparotomy scar. He has a marble-sized, easily reducible umbilical  hernia. Abdomen is soft and nontender without appreciable mass or organomegaly  Pulses:  Normal pulses noted. Extremities:  Without clubbing or edema.  Impression/Plan:   Very pleasant 51 year old gentleman with a one to two-year history of insidiously progressing atypical chest discomfort with associated belching and solid food dysphagia. It sounds like to me the patient is having a GERD symptoms-although somewhat atypical. No exertional component. I suspect his symptoms are largely GI in origin. Esophageal dysphagia noted and somewhat worrisome given  the long-standing tobacco exposure. 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 Eosinophilic esophagitis and occult neoplasm which certainly remained in the differential. He recently started taking niacin but it does not sound like he has any new symptoms related to this medication.   Recommendations: Will go directly to diagnostic EGD. He may or may not benefit from esophageal dilation.The risks, benefits, limitations, alternatives and imponderables have been reviewed with the patient. Potential for esophageal dilation, biopsy, etc. have also been reviewed.  Questions have been answered. All parties agreeable.  We'll withhold empiric treatment for GERD until endoscopic evaluation has been performed. Further recommendations to follow. I'd like to thank Dr. Mariel Sleet allowing me see this nice gentleman once again.

## 2012-08-11 NOTE — Op Note (Signed)
[facility] [address] [place], [address]   ENDOSCOPY PROCEDURE REPORT  PATIENT: [name], [name]  MR#: [id number] BIRTHDATE: [birth year] , 51  yrs. old GENDER: Male ENDOSCOPIST: R. Roetta Sessions, MD FACP [facility] R.  Roetta Sessions, MD FACP [facility] REFERRED BY:  Dr. Frederica Kuster, [facility] [place] PROCEDURE DATE:  08/11/2012 PROCEDURE:     EGD with Elease Hashimoto dilation followed by gastric and duodenal biopsy.  INDICATIONS:      GERD/esophageal dysphagia/belching  INFORMED CONSENT:   The risks, benefits, limitations, alternatives and imponderables have been discussed.  The potential for biopsy, esophogeal dilation, etc. have also been reviewed.  Questions have been answered.  All parties agreeable.  Please see the history and physical in the medical record for more information.  MEDICATIONS:   Versed 5 mg IV and Demerol 100 mg IV in divided doses. Cetacaine spray.  DESCRIPTION OF PROCEDURE:   The EG-[id number] ([id number])  endoscope was introduced through the mouth and advanced to the second portion of the duodenum without difficulty or limitations.  The mucosal surfaces were surveyed very carefully during advancement of the scope and upon withdrawal.  Retroflexion view of the proximal stomach and esophagogastric junction was performed.      FINDINGS: Distal esophageal erosions within 1-1/2-2 cm of the GE junction. Distal esophageal ring present with the soft superimposed stricture. No Barrett's esophagus. NoTumor. Stomach empty. 2 cm hiatal hernia. Multiple antral erosions. No ulcer or infiltrating process in the stomach. Pylorus patent. Examination of bulb second third portion revealed multiple erosions and a couple of areas of mucosal excavation consistent with ulceration M.D.posterior bulb and second portion of the duodenum. Please see multiple photographs taken.  THERAPEUTIC / DIAGNOSTIC MANEUVERS PERFORMED:  A 56 [place] Elease Hashimoto was passed to full insertion with  slight resistance . A look back revealed a nice mucosal disruption of the ring/stricture with this maneuver without apparent complication.. Subsequently, biopsies of the antrum and duodenal mucosa were taken and submitted separately.   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 Hiatal hernia. Antral erosions duodenal erosions and ulceration-likely NSAID effect. Status post biopsy.  RECOMMENDATIONS:  Stop Aleve and all NSAIDs. Begin Dexilant 60 mg daily. Patient is to go by my office for free samples and prescription provided.  Information on GERD provided to the patient. Further recommendations to follow pending review of pathology report.    _______________________________ R. Roetta Sessions, MD FACP [facility] eSigned:  R. Roetta Sessions, MD FACP [facility] 08/11/2012 8:21 AM     CC:  PATIENT NAME:  [name], [name] MR#: [id number]

## 2012-08-12 ENCOUNTER — Telehealth: Payer: Self-pay | Admitting: Internal Medicine

## 2012-08-12 NOTE — Telephone Encounter (Signed)
Pt called this evening -  In [facility]. Had EGD w ED of ring /stricture yesterday; ok on look back; NSAID PUD as well.  Pt describes lumbar back pain today - slept on couch all day yesterday and rode in car to [facility] today. No UTI sx, no abdominal pain, no chest pain fever; on  dexilant now. Dysphagia much better. No odynophagia.  No pain in upper or even mid back;   I told pt most likley MS in origin (no AAA on 1/13 CT). I feel very unlikely to be related to yesterdays procedure. Recommend symptomatic measures/ non-asa pain relievers; To ED expeditiously if pain didn't settle down or gets worse.

## 2012-08-21 ENCOUNTER — Encounter: Payer: Self-pay | Admitting: Internal Medicine

## 2012-08-22 ENCOUNTER — Encounter: Payer: Self-pay | Admitting: *Deleted

## 2012-09-26 ENCOUNTER — Encounter (HOSPITAL_COMMUNITY): Payer: Federal, State, Local not specified - PPO | Attending: Oncology

## 2012-09-26 DIAGNOSIS — T451X5A Adverse effect of antineoplastic and immunosuppressive drugs, initial encounter: Secondary | ICD-10-CM | POA: Insufficient documentation

## 2012-09-26 DIAGNOSIS — J4489 Other specified chronic obstructive pulmonary disease: Secondary | ICD-10-CM | POA: Insufficient documentation

## 2012-09-26 DIAGNOSIS — C187 Malignant neoplasm of sigmoid colon: Secondary | ICD-10-CM | POA: Insufficient documentation

## 2012-09-26 DIAGNOSIS — J449 Chronic obstructive pulmonary disease, unspecified: Secondary | ICD-10-CM | POA: Insufficient documentation

## 2012-09-26 LAB — CEA: CEA: <0.5 ng/mL (ref 0.0–5.0)

## 2012-09-26 NOTE — Progress Notes (Signed)
Labs drawn today for cea 

## 2012-09-28 ENCOUNTER — Other Ambulatory Visit (HOSPITAL_COMMUNITY): Payer: Federal, State, Local not specified - PPO

## 2012-09-29 ENCOUNTER — Telehealth: Payer: Self-pay

## 2012-09-29 NOTE — Telephone Encounter (Signed)
Per RMR- he spoke with pt, doing well with Dexilant.  ok to give pt 3 weeks worth of Dexilant samples. Left #4 boxes at the front desk.

## 2012-11-23 ENCOUNTER — Telehealth: Payer: Self-pay

## 2012-11-23 NOTE — Telephone Encounter (Signed)
Per RMR- pt is having constipation and rectal bleeding. He advised pt to take otc Miralax 17gr daily and call in anusol supp 1 qhs x 10 days to [facility] [address] in [place]. rx has been called in.   RMR wants pt to have an appt with extender sometime between now and January 1st to schedule tcs in January d/t his hx of colon cancer. Darl Pikes please schedule pt.

## 2012-11-24 NOTE — Telephone Encounter (Signed)
Offered pt an OV for Monday 12/16 but he is going hunting, offered him 12/19 but he has a Christmas party that day. Pt agreed to come on Wed 12/18 at 0830 with KJ. I told patient that RMR wanted him seen before January 1st

## 2012-11-29 ENCOUNTER — Encounter: Payer: Self-pay | Admitting: Internal Medicine

## 2012-11-30 ENCOUNTER — Encounter (HOSPITAL_COMMUNITY): Payer: Self-pay | Admitting: Pharmacy Technician

## 2012-11-30 ENCOUNTER — Ambulatory Visit (INDEPENDENT_AMBULATORY_CARE_PROVIDER_SITE_OTHER): Payer: Federal, State, Local not specified - PPO | Admitting: Urgent Care

## 2012-11-30 ENCOUNTER — Encounter: Payer: Self-pay | Admitting: Urgent Care

## 2012-11-30 VITALS — BP 126/78 | HR 53 | Temp 98.0°F | Ht 70.0 in | Wt 204.4 lb

## 2012-11-30 DIAGNOSIS — K2210 Ulcer of esophagus without bleeding: Secondary | ICD-10-CM

## 2012-11-30 DIAGNOSIS — K5900 Constipation, unspecified: Secondary | ICD-10-CM | POA: Insufficient documentation

## 2012-11-30 DIAGNOSIS — K921 Melena: Secondary | ICD-10-CM

## 2012-11-30 DIAGNOSIS — C187 Malignant neoplasm of sigmoid colon: Secondary | ICD-10-CM

## 2012-11-30 MED ORDER — DEXLANSOPRAZOLE 60 MG PO CPDR: 60.0000 mg | DELAYED_RELEASE_CAPSULE | Freq: Every day | ORAL | Status: DC

## 2012-11-30 MED ORDER — PEG 3350-KCL-NA BICARB-NACL 420 G PO SOLR: 4000.0000 mL | ORAL | Status: DC

## 2012-11-30 NOTE — Progress Notes (Signed)
Faxed to PCP

## 2012-11-30 NOTE — Assessment & Plan Note (Signed)
[name] is a pleasant 51 y.o. male with history of adenocarcinoma of colon in 2008 who presents with painless small volume hematochezia that is intermittent. Colonoscopy for further evaluation to rule out recurrent disease and determine source of hematochezia with Dr. Jena Gauss.  I have discussed risks & benefits which include, but are not limited to, bleeding, infection, perforation & drug reaction.  The patient agrees with this plan & written consent will be obtained.

## 2012-11-30 NOTE — Assessment & Plan Note (Signed)
Improved with MiraLax 17 g daily. May titrate to as needed at this point.

## 2012-11-30 NOTE — Patient Instructions (Addendum)
Dexilant 60mg  daily for acid reflux Finish anusol suppositories Use miralax 17 grams daily as needed for constipation Colonoscopy with Dr Jena Gauss

## 2012-11-30 NOTE — Assessment & Plan Note (Signed)
Patient doing well status post esophageal dilation August 2013 on Dexilant 60 mg daily. He is asymptomatic.  Continue Dexilant 60 mg daily, #15 samples and rebate card given

## 2012-11-30 NOTE — Progress Notes (Signed)
Primary Care Physician:  Frederica Kuster, MD Primary Gastroenterologist:  Dr. Jena Gauss  Chief Complaint  Patient presents with  . Follow-up    Erosive reflux esophagitis, hematochezia, history of colon cancer    HPI:  [name] is a 51 y.o. male here for further evaluation of hematochezia to set up colonoscopy and followup GERD.  He had called in with problems with constipation and scant rectal bleeding. He was started on MiraLax 17 g daily for constipation and given Anusol-HC suppositories twice a day by Dr. Jena Gauss. This has helped his constipation.  He has history of adenocarcinoma of sigmoid colon in 2008 were and an apple core lesion was found on colonoscopy by Dr. Jena Gauss 35 cm from anal verge.  Last colonoscopy by Dr. Jena Gauss demonstrated a hyperplastic polyp in January of 2011.  He is taking Dexilant 60 mg daily for erosive reflux esophagitis. On last EGD August 2013 by Dr. Jena Gauss she had a distal esophageal ring with a superimposed stricture that was dilated with a 5 to 13F Maloney dilator. He had also had gastritis and duodenitis likely secondary to NSAIDs as he was taking Aleve. His chest pain has resolved. He feels well.  Denies dysphagia or odynophagia.    Past Medical History  Diagnosis Date  . Adenocarcinoma of sigmoid colon 2008  . Hypokalemia   . Umbilical hernia   . Carpal tunnel syndrome   . COPD (chronic obstructive pulmonary disease)     Past Surgical History  Procedure Date  . Colon resection   . Appendectomy 1982  . Carpal tunnel release   . Colonoscopy 12/30/2009    Dr. Jena Gauss- s/p segmental L colon resection with anastomosis identified at 22cm. no adenomatous change or malignancy identified.   . Flexible sigmoidoscopy   07/08/2007    RMR: Lobulated 1 cm rectal polyp at 10 cm, resected as described above/ Multiple distal sigmoid diminutive polyps ablated with the tip of hot snare cautery unit/Obstructing apple core neoplasm beginning at 35 cm from the anal verge, biopsied  multiple times  . Colonoscopy  01/11/2008    RMR:  Colonic mucosa from the colostomy to the cecum appeared normal.  . Colonoscopy  07/09/2008    RMR: Normal rectum, surgical anastomosis was 22 cm, polypoid tissue at anastomosis that was a recurrent neoplasm, it was removed with hot snare.  The remainder of residual colon appeared normal.  Relative poor prep on the right side made exam more challenging.  . Esophagogastroduodenoscopy 08/11/12    Rourk-erosive reflux esophagitis, distal esophageal ring/superimposed stricture s/p 513F Maloney, hiatal hernia, gastritis/duodenitis secondary to NSAIDs. Biopsy benign.    Current Outpatient Prescriptions  Medication Sig Dispense Refill  . aspirin 81 MG tablet Take 81 mg by mouth daily.      . cholecalciferol (VITAMIN D) 1000 UNITS tablet Take 1,000 Units by mouth daily.      Marland Kitchen dexlansoprazole (DEXILANT) 60 MG capsule Take 1 capsule (60 mg total) by mouth daily.  30 capsule  11  . fish oil-omega-3 fatty acids 1000 MG capsule Take 1 g by mouth daily.       . hydrocortisone (ANUSOL-HC) 25 MG suppository Place 1 suppository rectally Twice daily.      . niacin (NIASPAN) 500 MG CR tablet Take 500 mg by mouth at bedtime.      . pregabalin (LYRICA) 75 MG capsule Take 75 mg by mouth as needed. Foot Pain        Allergies as of 11/30/2012 - Review Complete 11/30/2012  Allergen  Reaction Noted  . Codeine Nausea And Vomiting 06/26/2011    Family History  Problem Relation Age of Onset  . Emphysema Father   . Heart attack Father     History   Social History  . Marital Status: Married    Spouse Name: N/A    Number of Children: 1  . Years of Education: N/A   Occupational History  . [employer]    Social History Main Topics  . Smoking status: Former Games developer  . Smokeless tobacco: Never Used  . Alcohol Use: Yes  . Drug Use: No  . Sexually Active: Yes   Other Topics Concern  . Not on file   Social History Narrative   Lives with wife & daughter     Review of Systems: Gen: Denies any fever, chills, sweats, anorexia, fatigue, weakness, malaise, weight loss, and sleep disorder CV: Denies chest pain, angina, palpitations, syncope, orthopnea, PND, peripheral edema, and claudication. Resp: Denies dyspnea at rest, dyspnea with exercise, cough, sputum, wheezing, coughing up blood, and pleurisy. GI: Denies vomiting blood, jaundice, and fecal incontinence.   GU : Denies urinary burning, blood in urine, urinary frequency, urinary hesitancy, nocturnal urination, and urinary incontinence. MS: Denies joint pain, limitation of movement, and swelling, stiffness, low back pain, extremity pain. Denies muscle weakness, cramps, atrophy.  Derm: Denies rash, itching, dry skin, hives, moles, warts, or unhealing ulcers.  Psych: Denies depression, anxiety, memory loss, suicidal ideation, hallucinations, paranoia, and confusion. Heme: Denies bruising or enlarged lymph nodes. Neuro:  Denies any headaches, dizziness, paresthesias. Endo:  Denies any problems with DM, thyroid, adrenal function.  Physical Exam: BP 126/78  Pulse 53  Temp 98 F (36.7 C) (Oral)  Ht 5\' 10"  (1.778 m)  Wt 204 lb 6.4 oz (92.715 kg)  BMI 29.33 kg/m2 No LMP for male patient. General:   Alert,  Well-developed, well-nourished, pleasant and cooperative in NAD Head:  Normocephalic and atraumatic. Eyes:  Sclera clear, no icterus.   Conjunctiva pink. Ears:  Normal auditory acuity. Nose:  No deformity, discharge, or lesions. Mouth:  No deformity or lesions,oropharynx pink & moist. Neck:  Supple; no masses or thyromegaly. Lungs:  Clear throughout to auscultation.   No wheezes, crackles, or rhonchi. No acute distress. Heart:  Regular rate and rhythm; no murmurs, clicks, rubs,  or gallops. Abdomen:  Normal bowel sounds.  No bruits.  Soft, non-tender and non-distended without masses, hepatosplenomegaly or hernias noted.  No guarding or rebound tenderness.   Rectal:  Deferred. Msk:   Symmetrical without gross deformities. Normal posture. Pulses:  Normal pulses noted. Extremities:  No clubbing or edema. Neurologic:  Alert and  oriented x4;  grossly normal neurologically. Skin:  Intact without significant lesions or rashes. Lymph Nodes:  No significant cervical adenopathy. Psych:  Alert and cooperative. Normal mood and affect.

## 2012-12-01 ENCOUNTER — Ambulatory Visit: Payer: Federal, State, Local not specified - PPO | Admitting: Urgent Care

## 2012-12-16 ENCOUNTER — Ambulatory Visit (HOSPITAL_COMMUNITY)
Admission: RE | Admit: 2012-12-16 | Discharge: 2012-12-16 | Disposition: A | Discharge status: Home / Self Care | Payer: Federal, State, Local not specified - PPO | Source: Ambulatory Visit | Attending: Internal Medicine | Admitting: Internal Medicine

## 2012-12-16 ENCOUNTER — Encounter (HOSPITAL_COMMUNITY): Payer: Self-pay | Admitting: *Deleted

## 2012-12-16 ENCOUNTER — Encounter (HOSPITAL_COMMUNITY)
Admission: RE | Disposition: A | Discharge status: Home / Self Care | Payer: Self-pay | Source: Ambulatory Visit | Attending: Internal Medicine

## 2012-12-16 DIAGNOSIS — D126 Benign neoplasm of colon, unspecified: Secondary | ICD-10-CM

## 2012-12-16 DIAGNOSIS — J4489 Other specified chronic obstructive pulmonary disease: Secondary | ICD-10-CM | POA: Insufficient documentation

## 2012-12-16 DIAGNOSIS — J449 Chronic obstructive pulmonary disease, unspecified: Secondary | ICD-10-CM | POA: Insufficient documentation

## 2012-12-16 DIAGNOSIS — Z9049 Acquired absence of other specified parts of digestive tract: Secondary | ICD-10-CM | POA: Insufficient documentation

## 2012-12-16 DIAGNOSIS — K921 Melena: Secondary | ICD-10-CM

## 2012-12-16 DIAGNOSIS — Z01812 Encounter for preprocedural laboratory examination: Secondary | ICD-10-CM | POA: Insufficient documentation

## 2012-12-16 DIAGNOSIS — Z85038 Personal history of other malignant neoplasm of large intestine: Secondary | ICD-10-CM

## 2012-12-16 DIAGNOSIS — C187 Malignant neoplasm of sigmoid colon: Secondary | ICD-10-CM

## 2012-12-16 DIAGNOSIS — K5900 Constipation, unspecified: Secondary | ICD-10-CM

## 2012-12-16 HISTORY — PX: COLONOSCOPY: SHX5424

## 2012-12-16 LAB — CBC
MCV: 84.8 fL (ref 78.0–100.0)
Platelets: 211 10*3/uL (ref 150–400)
RBC: 5.39 MIL/uL (ref 4.22–5.81)
WBC: 7.8 10*3/uL (ref 4.0–10.5)

## 2012-12-16 SURGERY — COLONOSCOPY
Anesthesia: Moderate Sedation

## 2012-12-16 MED ORDER — MIDAZOLAM HCL 5 MG/5ML IJ SOLN
INTRAMUSCULAR | Status: DC | PRN
  Administered 2012-12-16: 1 mg via INTRAVENOUS
  Administered 2012-12-16: 2 mg via INTRAVENOUS
  Administered 2012-12-16: 1 mg via INTRAVENOUS
  Administered 2012-12-16: 2 mg via INTRAVENOUS

## 2012-12-16 MED ORDER — MIDAZOLAM HCL 5 MG/5ML IJ SOLN
INTRAMUSCULAR | Status: AC
  Filled 2012-12-16: qty 10

## 2012-12-16 MED ORDER — MEPERIDINE HCL 100 MG/ML IJ SOLN
INTRAMUSCULAR | Status: AC
  Filled 2012-12-16: qty 2

## 2012-12-16 MED ORDER — SODIUM CHLORIDE 0.45 % IV SOLN
INTRAVENOUS | Status: DC
  Administered 2012-12-16: 1000 mL via INTRAVENOUS

## 2012-12-16 MED ORDER — MEPERIDINE HCL 100 MG/ML IJ SOLN
INTRAMUSCULAR | Status: DC | PRN
  Administered 2012-12-16 (×2): 25 mg via INTRAVENOUS
  Administered 2012-12-16: 50 mg via INTRAVENOUS

## 2012-12-16 MED ORDER — STERILE WATER FOR IRRIGATION IR SOLN
Status: DC | PRN
  Administered 2012-12-16: 09:00:00

## 2012-12-16 NOTE — H&P (View-Only) (Signed)
Primary Care Physician:  LAZO,IVAN, MD Primary Gastroenterologist:  Dr. Rourk  Chief Complaint  Patient presents with  . Follow-up    Erosive reflux esophagitis, hematochezia, history of colon cancer    HPI:  [name] is a 52 y.o. male here for further evaluation of hematochezia to set up colonoscopy and followup GERD.  He had called in with problems with constipation and scant rectal bleeding. He was started on MiraLax 17 g daily for constipation and given Anusol-HC suppositories twice a day by Dr. Rourk. This has helped his constipation.  He has history of adenocarcinoma of sigmoid colon in 2008 were and an apple core lesion was found on colonoscopy by Dr. Rourk 35 cm from anal verge.  Last colonoscopy by Dr. Rourk demonstrated a hyperplastic polyp in January of 2011.  He is taking Dexilant 60 mg daily for erosive reflux esophagitis. On last EGD August 2013 by Dr. Rourk she had a distal esophageal ring with a superimposed stricture that was dilated with a 5 to 6F Maloney dilator. He had also had gastritis and duodenitis likely secondary to NSAIDs as he was taking Aleve. His chest pain has resolved. He feels well.  Denies dysphagia or odynophagia.    Past Medical History  Diagnosis Date  . Adenocarcinoma of sigmoid colon 2008  . Hypokalemia   . Umbilical hernia   . Carpal tunnel syndrome   . COPD (chronic obstructive pulmonary disease)     Past Surgical History  Procedure Date  . Colon resection   . Appendectomy 1982  . Carpal tunnel release   . Colonoscopy 12/30/2009    Dr. Rourk- s/p segmental L colon resection with anastomosis identified at 22cm. no adenomatous change or malignancy identified.   . Flexible sigmoidoscopy   07/08/2007    RMR: Lobulated 1 cm rectal polyp at 10 cm, resected as described above/ Multiple distal sigmoid diminutive polyps ablated with the tip of hot snare cautery unit/Obstructing apple core neoplasm beginning at 35 cm from the anal verge, biopsied  multiple times  . Colonoscopy  01/11/2008    RMR:  Colonic mucosa from the colostomy to the cecum appeared normal.  . Colonoscopy  07/09/2008    RMR: Normal rectum, surgical anastomosis was 22 cm, polypoid tissue at anastomosis that was a recurrent neoplasm, it was removed with hot snare.  The remainder of residual colon appeared normal.  Relative poor prep on the right side made exam more challenging.  . Esophagogastroduodenoscopy 08/11/12    Rourk-erosive reflux esophagitis, distal esophageal ring/superimposed stricture s/p 56F Maloney, hiatal hernia, gastritis/duodenitis secondary to NSAIDs. Biopsy benign.    Current Outpatient Prescriptions  Medication Sig Dispense Refill  . aspirin 81 MG tablet Take 81 mg by mouth daily.      . cholecalciferol (VITAMIN D) 1000 UNITS tablet Take 1,000 Units by mouth daily.      . dexlansoprazole (DEXILANT) 60 MG capsule Take 1 capsule (60 mg total) by mouth daily.  30 capsule  11  . fish oil-omega-3 fatty acids 1000 MG capsule Take 1 g by mouth daily.       . hydrocortisone (ANUSOL-HC) 25 MG suppository Place 1 suppository rectally Twice daily.      . niacin (NIASPAN) 500 MG CR tablet Take 500 mg by mouth at bedtime.      . pregabalin (LYRICA) 75 MG capsule Take 75 mg by mouth as needed. Foot Pain        Allergies as of 11/30/2012 - Review Complete 11/30/2012  Allergen   Reaction Noted  . Codeine Nausea And Vomiting 06/26/2011    Family History  Problem Relation Age of Onset  . Emphysema Father   . Heart attack Father     History   Social History  . Marital Status: Married    Spouse Name: N/A    Number of Children: 1  . Years of Education: N/A   Occupational History  . [employer]    Social History Main Topics  . Smoking status: Former Smoker  . Smokeless tobacco: Never Used  . Alcohol Use: Yes  . Drug Use: No  . Sexually Active: Yes   Other Topics Concern  . Not on file   Social History Narrative   Lives with wife & daughter     Review of Systems: Gen: Denies any fever, chills, sweats, anorexia, fatigue, weakness, malaise, weight loss, and sleep disorder CV: Denies chest pain, angina, palpitations, syncope, orthopnea, PND, peripheral edema, and claudication. Resp: Denies dyspnea at rest, dyspnea with exercise, cough, sputum, wheezing, coughing up blood, and pleurisy. GI: Denies vomiting blood, jaundice, and fecal incontinence.   GU : Denies urinary burning, blood in urine, urinary frequency, urinary hesitancy, nocturnal urination, and urinary incontinence. MS: Denies joint pain, limitation of movement, and swelling, stiffness, low back pain, extremity pain. Denies muscle weakness, cramps, atrophy.  Derm: Denies rash, itching, dry skin, hives, moles, warts, or unhealing ulcers.  Psych: Denies depression, anxiety, memory loss, suicidal ideation, hallucinations, paranoia, and confusion. Heme: Denies bruising or enlarged lymph nodes. Neuro:  Denies any headaches, dizziness, paresthesias. Endo:  Denies any problems with DM, thyroid, adrenal function.  Physical Exam: BP 126/78  Pulse 53  Temp 98 F (36.7 C) (Oral)  Ht 5' 10" (1.778 m)  Wt 204 lb 6.4 oz (92.715 kg)  BMI 29.33 kg/m2 No LMP for male patient. General:   Alert,  Well-developed, well-nourished, pleasant and cooperative in NAD Head:  Normocephalic and atraumatic. Eyes:  Sclera clear, no icterus.   Conjunctiva pink. Ears:  Normal auditory acuity. Nose:  No deformity, discharge, or lesions. Mouth:  No deformity or lesions,oropharynx pink & moist. Neck:  Supple; no masses or thyromegaly. Lungs:  Clear throughout to auscultation.   No wheezes, crackles, or rhonchi. No acute distress. Heart:  Regular rate and rhythm; no murmurs, clicks, rubs,  or gallops. Abdomen:  Normal bowel sounds.  No bruits.  Soft, non-tender and non-distended without masses, hepatosplenomegaly or hernias noted.  No guarding or rebound tenderness.   Rectal:  Deferred. Msk:   Symmetrical without gross deformities. Normal posture. Pulses:  Normal pulses noted. Extremities:  No clubbing or edema. Neurologic:  Alert and  oriented x4;  grossly normal neurologically. Skin:  Intact without significant lesions or rashes. Lymph Nodes:  No significant cervical adenopathy. Psych:  Alert and cooperative. Normal mood and affect.   

## 2012-12-16 NOTE — Op Note (Signed)
[facility] [address] [place], [address]   COLONOSCOPY PROCEDURE REPORT  PATIENT: [name], [name]  MR#:         [id number] BIRTHDATE: [birth year] , 51  yrs. old GENDER: Male ENDOSCOPIST: R.  Roetta Sessions, MD [facility] REFERRED BY:  Dr. Frederica Kuster - [place];       Dr. Mariel Sleet PROCEDURE DATE:  12/16/2012 PROCEDURE:     Colonoscopy with biopsy  INDICATIONS: hematochezia; history of colorectal cancer  INFORMED CONSENT:  The risks, benefits, alternatives and imponderables including but not limited to bleeding, perforation as well as the possibility of a missed lesion have been reviewed.  The potential for biopsy, lesion removal, etc. have also been discussed.  Questions have been answered.  All parties agreeable. Please see the history and physical in the medical record for more information.  MEDICATIONS: Versed 6 mg IV and Demerol 100 mg IV in divided doses.  DESCRIPTION OF PROCEDURE:  After a digital rectal exam was performed, the Pentax Colonoscope [id number]  colonoscope was advanced from the anus through the rectum and colon to the area of the cecum, ileocecal valve and appendiceal orifice.  The cecum was deeply intubated.  These structures were well-seen and photographed for the record.  From the level of the cecum and ileocecal valve, the scope was slowly and cautiously withdrawn.  The mucosal surfaces were carefully surveyed utilizing scope tip deflection to facilitate fold flattening as needed.  The scope was pulled down into the rectum where a thorough examination including retroflexion was performed.    FINDINGS:  Adequate preparation. Somewhat friable anal canal; no significant hemorrhoidal disease. Normal-appearing rectal mucosa. Surgical anastomosis at 23 cm from the anal verge. The patient had (1) diminutive polyp in the descending segment and (1) diminutive polyp at the ileocecal valve; the remainder of the colonic mucosa appeared  normal.  THERAPEUTIC / DIAGNOSTIC MANEUVERS PERFORMED:  The 2 above-mentioned polyps were cold biopsy removed.  COMPLICATIONS: None  CECAL WITHDRAWAL TIME:  12 minutes  IMPRESSION:  Status post left hemicolectomy. Diminutive colon polyps-removed as described above. Friable ano-rectum-likely source of hematochezia.  RECOMMENDATIONS: Benefiber 2 tablespoons daily. Utilize MiraLax 17 g orally at bedtime as needed for constipation. Avoid straining. CBC today. Followup on pathology.   _______________________________ eSigned:  R. Roetta Sessions, MD FACP [facility] 12/16/2012 9:32 AM   CC:    PATIENT NAME:  [name], [name] MR#: [id number]

## 2012-12-16 NOTE — Interval H&P Note (Signed)
History and Physical Interval Note:  12/16/2012 8:42 AM  Susumu Rayna Sexton  has presented today for surgery, with the diagnosis of HEMATOCHEZIA, CARCINOMA OF THE SIGMOID COLON, CONSTIPATION  The various methods of treatment have been discussed with the patient and family. After consideration of risks, benefits and other options for treatment, the patient has consented to  Procedure(s) (LRB) with comments: COLONOSCOPY (N/A) - 8:30 as a surgical intervention .  The patient's history has been reviewed, patient examined, no change in status, stable for surgery.  I have reviewed the patient's chart and labs.  Questions were answered to the patient's satisfaction.     Laporche Martelle  Constipation improved MiraLax.  Still with scant amount of blood per rectum occasionally. Colonoscopy today per plan.The risks, benefits, limitations, alternatives and imponderables have been reviewed with the patient. Questions have been answered. All parties are agreeable.

## 2012-12-19 ENCOUNTER — Encounter (HOSPITAL_COMMUNITY): Payer: Self-pay | Admitting: Internal Medicine

## 2012-12-20 ENCOUNTER — Encounter: Payer: Self-pay | Admitting: Internal Medicine

## 2012-12-20 ENCOUNTER — Encounter: Payer: Self-pay | Admitting: *Deleted

## 2013-01-03 ENCOUNTER — Telehealth: Payer: Self-pay

## 2013-01-03 MED ORDER — HYDROCORTISONE ACETATE 25 MG RE SUPP: 25.0000 mg | Freq: Two times a day (BID) | RECTAL | Status: DC

## 2013-01-03 MED ORDER — RABEPRAZOLE SODIUM 20 MG PO TBEC: 20.0000 mg | DELAYED_RELEASE_TABLET | Freq: Every day | ORAL | Status: DC

## 2013-01-03 NOTE — Telephone Encounter (Signed)
OK, please let pt know I will send Aciphex to pharmacy Also sent Anusol for friable anal canal He can take Miralax 17 grams daily as needed for constipation too. Thanks

## 2013-01-03 NOTE — Telephone Encounter (Signed)
Pt is aware. Informed him that we may have to do a PA in order for his insurance to pay for it and that pharmacy usually lets [place] know. He will call us if they wont pay for it and if the aciphex causes constipation. He has tried the miralax and he is taking benefiber and stated it doesn't seem to help. 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

## 2013-01-03 NOTE — Telephone Encounter (Signed)
Pt called- he has been on dexilant for awhile and has been constipated and has had some blood in his stool ( pt has had recent procedures) he stopped taking the dexilant 5 days ago and has had no problems with constipation and no bleeding. Pt has tried Brewing technologist and it caused constipation as well. Pt has not had a ppi in several days and he is not constipated now but he is having a lot of belching. Pt has never tried any other ppi's before and is requesting new ppi to be sent to his pharmacy. [facility].

## 2013-01-03 NOTE — Telephone Encounter (Signed)
Pt called back asking for JL, but JL has left for the day. Pt said to forget it. That the PA was still going to cost him $300 for his medicine and he was not going to take Dexilant. He said unless it can cost him $18-$20 he was not going to take anything. I told him again that JL was working on a PA and I would have someone call him to advise what his options are.

## 2013-01-04 NOTE — Telephone Encounter (Signed)
PA form has been faxed to [facility].

## 2013-01-05 NOTE — Telephone Encounter (Signed)
noted 

## 2013-01-06 ENCOUNTER — Telehealth: Payer: Self-pay | Admitting: Internal Medicine

## 2013-01-06 NOTE — Telephone Encounter (Signed)
Pt called this morning asking to speak with RMR and for him to call him at [phone]. He would not leave any details or a message, but it looks like it may be concerning his medication that he had called earlier in the week about.

## 2013-01-09 ENCOUNTER — Telehealth: Payer: Self-pay | Admitting: Internal Medicine

## 2013-01-09 ENCOUNTER — Ambulatory Visit (HOSPITAL_COMMUNITY)
Admission: RE | Admit: 2013-01-09 | Discharge: 2013-01-09 | Disposition: A | Discharge status: Home / Self Care | Payer: Federal, State, Local not specified - PPO | Source: Ambulatory Visit | Attending: Oncology | Admitting: Oncology

## 2013-01-09 ENCOUNTER — Encounter (HOSPITAL_COMMUNITY): Payer: Federal, State, Local not specified - PPO | Attending: Oncology

## 2013-01-09 DIAGNOSIS — J449 Chronic obstructive pulmonary disease, unspecified: Secondary | ICD-10-CM

## 2013-01-09 DIAGNOSIS — C187 Malignant neoplasm of sigmoid colon: Secondary | ICD-10-CM | POA: Insufficient documentation

## 2013-01-09 DIAGNOSIS — Z09 Encounter for follow-up examination after completed treatment for conditions other than malignant neoplasm: Secondary | ICD-10-CM | POA: Insufficient documentation

## 2013-01-09 DIAGNOSIS — J4489 Other specified chronic obstructive pulmonary disease: Secondary | ICD-10-CM | POA: Insufficient documentation

## 2013-01-09 DIAGNOSIS — Z85038 Personal history of other malignant neoplasm of large intestine: Secondary | ICD-10-CM | POA: Insufficient documentation

## 2013-01-09 DIAGNOSIS — Z9049 Acquired absence of other specified parts of digestive tract: Secondary | ICD-10-CM | POA: Insufficient documentation

## 2013-01-09 DIAGNOSIS — G609 Hereditary and idiopathic neuropathy, unspecified: Secondary | ICD-10-CM | POA: Insufficient documentation

## 2013-01-09 LAB — COMPREHENSIVE METABOLIC PANEL
ALT: 42 U/L (ref 0–53)
Alkaline Phosphatase: 83 U/L (ref 39–117)
CO2: 30 mEq/L (ref 19–32)
Chloride: 101 mEq/L (ref 96–112)
GFR calc Af Amer: 87 mL/min — ABNORMAL LOW (ref >90)
GFR calc non Af Amer: 75 mL/min — ABNORMAL LOW (ref >90)
Glucose, Bld: 99 mg/dL (ref 70–99)
Potassium: 4.1 mEq/L (ref 3.5–5.1)
Sodium: 138 mEq/L (ref 135–145)

## 2013-01-09 LAB — CBC
HCT: 48.7 % (ref 39.0–52.0)
Hemoglobin: 16.7 g/dL (ref 13.0–17.0)
RBC: 5.8 MIL/uL (ref 4.22–5.81)
WBC: 6.1 10*3/uL (ref 4.0–10.5)

## 2013-01-09 MED ORDER — PANTOPRAZOLE SODIUM 40 MG PO TBEC: 40.0000 mg | DELAYED_RELEASE_TABLET | Freq: Every day | ORAL | Status: DC

## 2013-01-09 MED ORDER — IOHEXOL 300 MG/ML  SOLN
100.0000 mL | Freq: Once | INTRAMUSCULAR | Status: AC | PRN
  Administered 2013-01-09: 100 mL via INTRAVENOUS

## 2013-01-09 NOTE — Addendum Note (Signed)
Addended by: Myra Rude on: 01/09/2013 10:00 AM   Modules accepted: Orders

## 2013-01-09 NOTE — Telephone Encounter (Signed)
Spoke with pt and per RMR, sent in rx for protonix.

## 2013-01-09 NOTE — Telephone Encounter (Signed)
Pt is aware, rx sent to pharmacy. He will call if he has problems with this medication.

## 2013-01-09 NOTE — Progress Notes (Signed)
Labs drawn today for cbc,cmp,cea  

## 2013-01-09 NOTE — Telephone Encounter (Signed)
Patient called me to let me know Dexilant works well for reflux the patient has bad constipation. AcipHex called in last week prior authorization in progress. Without PA, it will cause $300 a month which he cannot afford. It is also notable he states omeprazole previously also caused constipation. We will continue to pursue the prior authorization for AcipHex. However, in the interim we will try for pantoprazole 40 mg orally daily. Dispense 30 with one years refill. Brief chart review indicated that he has not taken this medication previously.

## 2013-01-11 ENCOUNTER — Encounter (HOSPITAL_COMMUNITY): Payer: Self-pay | Admitting: Oncology

## 2013-01-11 ENCOUNTER — Encounter (HOSPITAL_BASED_OUTPATIENT_CLINIC_OR_DEPARTMENT_OTHER): Payer: Federal, State, Local not specified - PPO | Admitting: Oncology

## 2013-01-11 VITALS — BP 108/74 | HR 58 | Temp 97.7°F | Resp 16 | Wt 209.4 lb

## 2013-01-11 DIAGNOSIS — J449 Chronic obstructive pulmonary disease, unspecified: Secondary | ICD-10-CM

## 2013-01-11 DIAGNOSIS — C187 Malignant neoplasm of sigmoid colon: Secondary | ICD-10-CM

## 2013-01-11 DIAGNOSIS — Z85038 Personal history of other malignant neoplasm of large intestine: Secondary | ICD-10-CM

## 2013-01-11 NOTE — Progress Notes (Signed)
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 His surgery was in July 2008. Blood work and CAT scans done the other day still showed him to be free of disease. His oncology review of systems remains noncontributory. He is fully functional. Performance status is 0. 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 We are going to therefore check a CEA the end of March and in the middle of June just before he leaves.  He is here to today with his wife. His vital signs are stable except his weight is up 10 pounds since July. His appetite is excellent. He is not getting as much exercise.  Lymph nodes remain negative throughout. These include cervical, supraclavicular, infraclavicular, axillary, and inguinal areas. His lungs remain clear to auscultation. Heart shows a regular rhythm and rate without murmur rub or gallop. Abdomen is soft and nontender without organomegaly. Bowel sounds are normal. Scars are well-healed. He still has his umbilical hernia that is asymptomatic. He has no leg edema no arm edema.  We will see him in a year but I would like for him to have this CEAs as mentioned in March and June of this year. I will stop doing CAT scans unless his lab work, review of systems, or physical exam change. He is out 6 years come this July.  Problem #2 COPD having quit smoking 2008 after smoking 1-2 packs a day for 30 years and so we may consider doing a CT of his chest once a year for 2 or 3 more years potentially. Problem #3 carpal tunnel syndrome Problem #4 grade 1 peripheral neuropathy which is essentially resolved Problem #5 umbilical hernia, asymptomatic

## 2013-01-11 NOTE — Patient Instructions (Signed)
[facility] Discharge Instructions  RECOMMENDATIONS MADE BY THE CONSULTANT AND ANY TEST RESULTS WILL BE SENT TO YOUR REFERRING PHYSICIAN.  EXAM FINDINGS BY THE PHYSICIAN TODAY AND SIGNS OR SYMPTOMS TO REPORT TO CLINIC OR PRIMARY PHYSICIAN: Exam findings are great as discussed by Dr. Mariel Sleet.  SPECIAL INSTRUCTIONS/FOLLOW-UP: 1.  CEA (lab draw) every 3 months and while deployed if possible 2.  Call our office upon your return from deployment to schedule your next office visit.  Thank you for choosing [facility] to provide your oncology and hematology care.  To afford each patient quality time with our providers, please arrive at least 15 minutes before your scheduled appointment time.  With your help, our goal is to use those 15 minutes to complete the necessary work-up to ensure our physicians have the information they need to help with your evaluation and healthcare recommendations.    Effective January 1st, 2014, we ask that you re-schedule your appointment with our physicians should you arrive 10 or more minutes late for your appointment.  We strive to give you quality time with our providers, and arriving late affects you and other patients whose appointments are after yours.    Again, thank you for choosing [facility].  Our hope is that these requests will decrease the amount of time that you wait before being seen by our physicians.       _____________________________________________________________  Should you have questions after your visit to [facility], please contact our office at [phone] between the hours of 8:30 a.m. and 5:00 p.m.  Voicemails left after 4:30 p.m. will not be returned until the following business day.  For prescription refill requests, have your pharmacy contact our office with your prescription refill request.

## 2013-03-06 ENCOUNTER — Encounter (HOSPITAL_COMMUNITY): Payer: Federal, State, Local not specified - PPO | Attending: Oncology

## 2013-03-06 DIAGNOSIS — C187 Malignant neoplasm of sigmoid colon: Secondary | ICD-10-CM | POA: Insufficient documentation

## 2013-03-06 DIAGNOSIS — J4489 Other specified chronic obstructive pulmonary disease: Secondary | ICD-10-CM | POA: Insufficient documentation

## 2013-03-06 DIAGNOSIS — J449 Chronic obstructive pulmonary disease, unspecified: Secondary | ICD-10-CM

## 2013-03-06 NOTE — Progress Notes (Signed)
Labs drawn today for cea 

## 2013-05-15 ENCOUNTER — Other Ambulatory Visit: Payer: Self-pay

## 2013-05-15 ENCOUNTER — Telehealth (HOSPITAL_COMMUNITY): Payer: Self-pay | Admitting: Oncology

## 2013-05-15 MED ORDER — PANTOPRAZOLE SODIUM 40 MG PO TBEC: 40.0000 mg | DELAYED_RELEASE_TABLET | Freq: Every day | ORAL | Status: DC

## 2013-05-15 NOTE — Telephone Encounter (Signed)
He needs a one time order for 120 days

## 2013-05-22 ENCOUNTER — Encounter (HOSPITAL_COMMUNITY): Payer: Federal, State, Local not specified - PPO | Attending: Oncology

## 2013-05-22 DIAGNOSIS — J449 Chronic obstructive pulmonary disease, unspecified: Secondary | ICD-10-CM

## 2013-05-22 DIAGNOSIS — J4489 Other specified chronic obstructive pulmonary disease: Secondary | ICD-10-CM | POA: Insufficient documentation

## 2013-05-22 DIAGNOSIS — C187 Malignant neoplasm of sigmoid colon: Secondary | ICD-10-CM | POA: Insufficient documentation

## 2013-05-22 DIAGNOSIS — Z85038 Personal history of other malignant neoplasm of large intestine: Secondary | ICD-10-CM

## 2013-05-22 NOTE — Progress Notes (Signed)
Labs drawn today for cea 

## 2014-04-11 ENCOUNTER — Telehealth (HOSPITAL_COMMUNITY): Payer: Self-pay

## 2014-04-13 NOTE — Progress Notes (Signed)
Michael Cloud, MD [place] [address]  Adenocarcinoma of sigmoid colon - Plan: simvastatin (ZOCOR) 20 MG tablet, CBC with Differential, Comprehensive metabolic panel, CEA, CBC with Differential, Comprehensive metabolic panel, CEA  CURRENT THERAPY: Surveillance per NCCN guidelines  INTERVAL HISTORY: [name] 53 y.o. 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 His surgery was in July 2008.     Adenocarcinoma of sigmoid colon   07/08/2007 Surgery Sigmoid colectomy- invasive, moderately differentiated adenocarcinoma, with invasion into the pericolonic adipose tissue and focally extyends to serousal surface. 6/22 lymph nodes.  Max tumor size was 4.0 cm.   08/25/2007 - 12/16/2007 Chemotherapy Oxaliplatin + Xeloda 1800 mg BID x 14 days    I personally reviewed and went over laboratory results with the patient.  The results are noted within this dictation.  I personally reviewed and went over radiographic studies with the patient.  The results are noted within this dictation.    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    NCCN guidelines for surveillance for T3/T4, N0-2, M0 colon cancer are:   A. H+P every 3-6 months x 2 years and then every 6 months for a total of 5 years   B. CEA every 3 months x 2 years and then every 6 months for a total of 5 years   C. CT abd/pelvis annually for up to 5 years   D. Colonoscopy in 1 year except if no preoperative colonoscopy due to obstructing lesion, colonoscopy in 3-6 months.    1. If advanced adenoma, repeat in 1 year   2. If no advanced adenoma, repeat in 3 years, then every 5 years  E. PET scan  not routinely recommended.  He has completed 5 years worth of surveillance that is recommended by the NCCN guidelines. His risk of recurrence is significantly less now that he is 5 years out.    Oncologically, he denies any complaints and ROS questioning is negative.   Past Medical History  Diagnosis Date  . Adenocarcinoma of sigmoid colon 2008  . Hypokalemia   . Umbilical hernia   . Carpal tunnel syndrome     has Adenocarcinoma of sigmoid colon; Hematochezia; Erosive esophagitis; and Constipation on his problem list.     is allergic to codeine.  Mr. Schlitt had no medications administered during this visit.  Past Surgical History  Procedure Laterality Date  . Colon resection    . Appendectomy  1982  . Carpal tunnel release    . Colonoscopy  12/30/2009    Dr. Gala Romney- s/p segmental L colon resection with anastomosis identified at 22cm. no adenomatous change or malignancy identified.   . Flexible sigmoidoscopy    07/08/2007    RMR: Lobulated 1 cm rectal polyp at 10 cm, resected as described above/ Multiple distal sigmoid diminutive polyps ablated with the tip of hot snare cautery unit/Obstructing apple core neoplasm beginning at 35 cm from the anal verge, biopsied multiple times  . Colonoscopy   01/11/2008    RMR:  Colonic mucosa from the colostomy to the cecum appeared normal.  . Colonoscopy   07/09/2008  RMR: Normal rectum, surgical anastomosis was 22 cm, polypoid tissue at anastomosis that was a recurrent neoplasm, it was removed with hot snare.  The remainder of residual colon appeared normal.  Relative poor prep on the right side made exam more challenging.  . Esophagogastroduodenoscopy  08/11/12    Rourk-erosive reflux esophagitis, distal esophageal ring/superimposed stricture s/p 50F Maloney, hiatal hernia, gastritis/duodenitis secondary to NSAIDs. Biopsy benign.  . Colonoscopy  12/16/2012    Procedure: COLONOSCOPY;  Surgeon: Daneil Dolin, MD;  [facility];   Service: Endoscopy;  Laterality: N/A;  8:30    Denies any headaches, dizziness, double vision, fevers, chills, night sweats, nausea, vomiting, diarrhea, constipation, chest pain, heart palpitations, shortness of breath, blood in stool, black tarry stool, urinary pain, urinary burning, urinary frequency, hematuria.   PHYSICAL EXAMINATION  ECOG PERFORMANCE STATUS: 0 - Asymptomatic  Filed Vitals:   04/16/14 0909  BP: 120/76  Pulse: 64  Temp: 97.6 F (36.4 C)  Resp: 20    GENERAL:alert, healthy, no distress, well nourished, well developed, comfortable, cooperative and smiling SKIN: skin color, texture, turgor are normal, no rashes or significant lesions HEAD: Normocephalic, No masses, lesions, tenderness or abnormalities EYES: normal, PERRLA, EOMI, Conjunctiva are pink and non-injected EARS: External ears normal OROPHARYNX:mucous membranes are moist  NECK: supple, no adenopathy, thyroid normal size, non-tender, without nodularity, no stridor, non-tender, trachea midline LYMPH:  no palpable lymphadenopathy, no hepatosplenomegaly BREAST:not examined LUNGS: clear to auscultation and percussion HEART: regular rate & rhythm, no murmurs, no gallops, S1 normal and S2 normal ABDOMEN:abdomen soft, non-tender, normal bowel sounds, no masses or organomegaly, surgical scars noted and no hepatosplenomegaly BACK: Back symmetric, no curvature., No CVA tenderness EXTREMITIES:less then 2 second capillary refill, no joint deformities, effusion, or inflammation, no edema, no skin discoloration, no clubbing, no cyanosis  NEURO: alert & oriented x 3 with fluent speech, no focal motor/sensory deficits, gait normal    LABORATORY DATA: CBC    Component Value Date/Time   WBC 6.1 01/09/2013 0821   RBC 5.80 01/09/2013 0821   HGB 16.7 01/09/2013 0821   HCT 48.7 01/09/2013 0821   PLT 218 01/09/2013 0821   MCV 84.0 01/09/2013 0821   MCH 28.8 01/09/2013 0821   MCHC 34.3 01/09/2013 0821   RDW 13.9 01/09/2013 0821    LYMPHSABS 2.8 12/30/2011 1155   MONOABS 0.5 12/30/2011 1155   EOSABS 0.3 12/30/2011 1155   BASOSABS 0.1 12/30/2011 1155      Chemistry      Component Value Date/Time   NA 138 01/09/2013 0821   K 4.1 01/09/2013 0821   CL 101 01/09/2013 0821   CO2 30 01/09/2013 0821   BUN 10 01/09/2013 0821   CREATININE 1.10 01/09/2013 0821      Component Value Date/Time   CALCIUM 9.7 01/09/2013 0821   ALKPHOS 83 01/09/2013 0821   AST 28 01/09/2013 0821   ALT 42 01/09/2013 0821   BILITOT 0.4 01/09/2013 0821     Lab Results  Component Value Date   CEA 0.6 05/22/2013     RADIOGRAPHIC STUDIES:  01/09/2013  *RADIOLOGY REPORT*  Clinical Data: Carcinoma sigmoid colon. Chemotherapy in surgery  2008.  CT CHEST, ABDOMEN AND PELVIS WITH CONTRAST  Technique: Multidetector CT imaging of the chest, abdomen and  pelvis was performed following the standard protocol during bolus  administration of intravenous contrast.  Contrast: 127mL OMNIPAQUE IOHEXOL 300 MG/ML SOLN  Comparison: CT 12/29/2011  CT CHEST  Findings: No axillary or supraclavicular lymphadenopathy. No  mediastinal or hilar lymphadenopathy. No pericardial fluid.  Calcified perihilar lymph nodes. No pericardial fluid. Esophagus  is normal. There is a small 11 mm low density lesion within the  left lobe the thyroid gland which is not changed.  No suspicious pulmonary nodules.  IMPRESSION:  1. Stable exam the chest.  2. No evidence metastasis.  CT ABDOMEN AND PELVIS  Findings: There is a vague low density lesion adjacent to the  falciform ligament in the left hepatic lobe measuring 10 mm  unchanged from comparison ( image 58). There are two < 5 mm  lesions in the superior left hepatic lobe which also unchanged. No  new hepatic lesions are present. Gallbladder, pancreas, spleen,  adrenal glands, and kidneys are normal.  The stomach, small bowel, cecum are normal. There is surgical  anastomosis in the sigmoid colon proximally without evidence of    nodularity obstruction.  There is a small umbilical hernia which contains a loop of  nonobstructed small bowel (image 88) which is similar to prior.  The left lower quadrant ostomy site is normal.  Within the pelvis, there is no free fluid or lymphadenopathy.  Bilateral fat filled inguinal hernias are present. The prostate  gland bladder normal.  IMPRESSION:  1. No evidence of colon cancer recurrence.  2. Stable anastomoses in the sigmoid colon.  3. Small umbilical hernia contains a nonobstructed loop of small  bowel.  4. Stable low density lesions within the liver.  Original Report Authenticated By: Suzy Bouchard, M.D.    ASSESSMENT:  1. Stage III adenocarcinoma of the sigmoid colon with a 4 cm cancer with 6 of 22 positive lymph nodes, grade 2, LV I was found, with surgery followed by chemotherapy consisting of oxaliplatin and capecitabine for 6 cycles. His surgery was in July 2008.  2. COPD having quit smoking 2008 after smoking 1-2 packs a day for 30 years  3. Carpal tunnel syndrome 4. Grade 1 peripheral neuropathy which is essentially resolved 5. Umbilical hernia, asymptomatic  Patient Active Problem List   Diagnosis Date Noted  . Hematochezia 11/30/2012  . Erosive esophagitis 11/30/2012  . Constipation 11/30/2012  . Adenocarcinoma of sigmoid colon 06/02/2011     PLAN:  1. I personally reviewed and went over laboratory results with the patient.  The results are noted within this dictation. 2. I personally reviewed and went over radiographic studies with the patient.  The results are noted within this dictation.   3. Labs today: CBC diff, CMET, CEA 4. Labs in 1 year: CBC diff, CMET, CEA 5. Return in 1 year for follow-up.   THERAPY PLAN:  He has completed 5 years worth of surveillance for his history of colon cancer.  There is no need to perform surveillance CT scans as this exposes him to increased radiation.  We will see him annually and continue labs including a CEA  once per year.  If he has any issues, he will contact our clinic.  He is to follow-up with GI as directed. NCCN guidelines for surveillance for T3/T4, N0-2, M0 colon cancer are:   A. H+P every 3-6 months x 2 years and then every 6 months for a total of 5 years   B. CEA every 3 months x 2 years and then every 6 months for a total of 5 years   C. CT abd/pelvis annually for up to 5 years   D. Colonoscopy in 1 year except if no preoperative colonoscopy due to obstructing lesion, colonoscopy in 3-6 months.  1. If advanced adenoma, repeat in 1 year   2. If no advanced adenoma, repeat in 3 years, then every 5 years  E. PET scan not routinely recommended.   All questions were answered. The patient knows to call the clinic with any problems, questions or concerns. We can certainly see the patient much sooner if necessary.  Patient and plan discussed with Dr. Farrel Gobble and he is in agreement with the aforementioned.   [facility] 04/16/2014

## 2014-04-16 ENCOUNTER — Encounter (HOSPITAL_COMMUNITY): Payer: Federal, State, Local not specified - PPO | Attending: Oncology | Admitting: Oncology

## 2014-04-16 ENCOUNTER — Encounter (HOSPITAL_BASED_OUTPATIENT_CLINIC_OR_DEPARTMENT_OTHER): Payer: Federal, State, Local not specified - PPO

## 2014-04-16 VITALS — BP 120/76 | HR 64 | Temp 97.6°F | Resp 20 | Wt 204.0 lb

## 2014-04-16 DIAGNOSIS — Z09 Encounter for follow-up examination after completed treatment for conditions other than malignant neoplasm: Secondary | ICD-10-CM | POA: Insufficient documentation

## 2014-04-16 DIAGNOSIS — K208 Other esophagitis without bleeding: Secondary | ICD-10-CM | POA: Diagnosis not present

## 2014-04-16 DIAGNOSIS — K5900 Constipation, unspecified: Secondary | ICD-10-CM | POA: Diagnosis not present

## 2014-04-16 DIAGNOSIS — Z9049 Acquired absence of other specified parts of digestive tract: Secondary | ICD-10-CM | POA: Diagnosis not present

## 2014-04-16 DIAGNOSIS — C187 Malignant neoplasm of sigmoid colon: Secondary | ICD-10-CM

## 2014-04-16 DIAGNOSIS — K429 Umbilical hernia without obstruction or gangrene: Secondary | ICD-10-CM | POA: Diagnosis not present

## 2014-04-16 DIAGNOSIS — Z9221 Personal history of antineoplastic chemotherapy: Secondary | ICD-10-CM | POA: Diagnosis not present

## 2014-04-16 DIAGNOSIS — Z85038 Personal history of other malignant neoplasm of large intestine: Secondary | ICD-10-CM

## 2014-04-16 DIAGNOSIS — Z87891 Personal history of nicotine dependence: Secondary | ICD-10-CM

## 2014-04-16 DIAGNOSIS — K4020 Bilateral inguinal hernia, without obstruction or gangrene, not specified as recurrent: Secondary | ICD-10-CM | POA: Insufficient documentation

## 2014-04-16 DIAGNOSIS — J449 Chronic obstructive pulmonary disease, unspecified: Secondary | ICD-10-CM

## 2014-04-16 DIAGNOSIS — K7689 Other specified diseases of liver: Secondary | ICD-10-CM | POA: Insufficient documentation

## 2014-04-16 LAB — COMPREHENSIVE METABOLIC PANEL
ALBUMIN: 4.1 g/dL (ref 3.5–5.2)
ALK PHOS: 82 U/L (ref 39–117)
ALT: 36 U/L (ref 0–53)
AST: 39 U/L — ABNORMAL HIGH (ref 0–37)
BUN: 14 mg/dL (ref 6–23)
CHLORIDE: 102 meq/L (ref 96–112)
CO2: 29 mEq/L (ref 19–32)
Calcium: 9.4 mg/dL (ref 8.4–10.5)
Creatinine, Ser: 0.99 mg/dL (ref 0.50–1.35)
GFR calc Af Amer: >90 mL/min (ref >90)
GFR calc non Af Amer: >90 mL/min (ref >90)
GLUCOSE: 97 mg/dL (ref 70–99)
POTASSIUM: 4.4 meq/L (ref 3.7–5.3)
SODIUM: 142 meq/L (ref 137–147)
TOTAL PROTEIN: 7.1 g/dL (ref 6.0–8.3)
Total Bilirubin: 0.5 mg/dL (ref 0.3–1.2)

## 2014-04-16 LAB — CBC WITH DIFFERENTIAL/PLATELET
BASOS ABS: 0.1 10*3/uL (ref 0.0–0.1)
BASOS PCT: 1 % (ref 0–1)
Eosinophils Absolute: 0.3 10*3/uL (ref 0.0–0.7)
Eosinophils Relative: 4 % (ref 0–5)
HCT: 46.8 % (ref 39.0–52.0)
HEMOGLOBIN: 15.8 g/dL (ref 13.0–17.0)
LYMPHS PCT: 37 % (ref 12–46)
Lymphs Abs: 2.5 10*3/uL (ref 0.7–4.0)
MCH: 28.6 pg (ref 26.0–34.0)
MCHC: 33.8 g/dL (ref 30.0–36.0)
MCV: 84.6 fL (ref 78.0–100.0)
MONOS PCT: 8 % (ref 3–12)
Monocytes Absolute: 0.5 10*3/uL (ref 0.1–1.0)
NEUTROS ABS: 3.4 10*3/uL (ref 1.7–7.7)
NEUTROS PCT: 50 % (ref 43–77)
Platelets: 218 10*3/uL (ref 150–400)
RBC: 5.53 MIL/uL (ref 4.22–5.81)
RDW: 14.4 % (ref 11.5–15.5)
WBC: 6.7 10*3/uL (ref 4.0–10.5)

## 2014-04-16 NOTE — Patient Instructions (Addendum)
[facility] Discharge Instructions  RECOMMENDATIONS MADE BY THE CONSULTANT AND ANY TEST RESULTS WILL BE SENT TO YOUR REFERRING PHYSICIAN.  EXAM FINDINGS BY THE PHYSICIAN TODAY AND SIGNS OR SYMPTOMS TO REPORT TO CLINIC OR PRIMARY PHYSICIAN:   We will see you in 1 year for doctor visit and labs.  Follow up with GI as directed.     Thank you for choosing [facility] to provide your oncology and hematology care.  To afford each patient quality time with our providers, please arrive at least 15 minutes before your scheduled appointment time.  With your help, our goal is to use those 15 minutes to complete the necessary work-up to ensure our physicians have the information they need to help with your evaluation and healthcare recommendations.    Effective January 1st, 2014, we ask that you re-schedule your appointment with our physicians should you arrive 10 or more minutes late for your appointment.  We strive to give you quality time with our providers, and arriving late affects you and other patients whose appointments are after yours.    Again, thank you for choosing [facility].  Our hope is that these requests will decrease the amount of time that you wait before being seen by our physicians.       _____________________________________________________________  Should you have questions after your visit to [facility], please contact our office at [phone] between the hours of 8:30 a.m. and 5:00 p.m.  Voicemails left after 4:30 p.m. will not be returned until the following business day.  For prescription refill requests, have your pharmacy contact our office with your prescription refill request.

## 2014-04-16 NOTE — Progress Notes (Signed)
Labs drawn today for cbc,cea,cmp

## 2014-04-17 LAB — CEA: CEA: 0.6 ng/mL (ref 0.0–5.0)

## 2015-04-16 NOTE — Progress Notes (Signed)
Michael Cloud, MD [place] [address]  Adenocarcinoma of sigmoid colon - Plan: VITAMIN D, CHOLECALCIFEROL, PO, CBC with Differential, Comprehensive metabolic panel, CEA  CURRENT THERAPY: Surveillance per NCCN guidelines  INTERVAL HISTORY: [name] 54 y.o. 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 His surgery was in July 2008.   Neuropathy continues to be a problem. 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 He also does 10 mile runs. He has run 7 marathons. He has Lyrica which she takes intermittently for his neuropathy. It is very sedating. He finds that it helps the neuropathy in his hands better and the neuropathy in his feet.  His last colonoscopy was in 2014.     Adenocarcinoma of sigmoid colon   07/08/2007 Surgery Sigmoid colectomy- invasive, moderately differentiated adenocarcinoma, with invasion into the pericolonic adipose tissue and focally extyends to serousal surface. 6/22 lymph nodes.  Max tumor size was 4.0 cm.   08/25/2007 - 12/16/2007 Chemotherapy Oxaliplatin + Xeloda 1800 mg BID x 14 days    I personally reviewed and went over laboratory results with the patient.  The results are noted within this dictation.  NCCN guidelines for surveillance for T3/T4, N0-2, M0 colon cancer are:   A. H+P every 3-6 months x 2 years and then every 6 months for a total of 5 years   B. CEA every 3 months x 2 years and then every 6 months for a total of 5 years   C. CT abd/pelvis annually for up to 5 years   D. Colonoscopy in 1 year except if no preoperative colonoscopy due to obstructing lesion, colonoscopy in 3-6 months.    1. If advanced adenoma, repeat in 1 year   2. If no advanced adenoma, repeat in 3  years, then every 5 years  E. PET scan not routinely recommended.  He has completed 5 years worth of surveillance that is recommended by the NCCN guidelines. His risk of recurrence is significantly less now that he is 5 years out.    Oncologically, he denies any complaints and ROS questioning is negative.   Past Medical History  Diagnosis Date  . Adenocarcinoma of sigmoid colon 2008  . Hypokalemia   . Umbilical hernia   . Carpal tunnel syndrome     has Adenocarcinoma of sigmoid colon; Hematochezia; Erosive esophagitis; and Constipation on his problem list.     is allergic to codeine.  [name] had no medications administered during this visit.  Past Surgical History  Procedure Laterality Date  . Colon resection    . Appendectomy  1982  . Carpal tunnel release    . Colonoscopy  12/30/2009    Dr. Gala Romney- s/p segmental L colon resection with anastomosis identified at 22cm. no adenomatous change or malignancy identified.   . Flexible sigmoidoscopy    07/08/2007    RMR: Lobulated 1 cm rectal polyp at 10 cm, resected as described above/ Multiple distal sigmoid diminutive polyps ablated with the tip of hot snare cautery unit/Obstructing apple core neoplasm beginning at 35 cm from the anal verge, biopsied multiple times  . Colonoscopy   01/11/2008    RMR:  Colonic mucosa from the colostomy  to the cecum appeared normal.  . Colonoscopy   07/09/2008    RMR: Normal rectum, surgical anastomosis was 22 cm, polypoid tissue at anastomosis that was a recurrent neoplasm, it was removed with hot snare.  The remainder of residual colon appeared normal.  Relative poor prep on the right side made exam more challenging.  . Esophagogastroduodenoscopy  08/11/12    Rourk-erosive reflux esophagitis, distal esophageal ring/superimposed stricture s/p 69F Maloney, hiatal hernia, gastritis/duodenitis secondary to NSAIDs. Biopsy benign.  . Colonoscopy  12/16/2012    Procedure: COLONOSCOPY;  Surgeon: Daneil Dolin, MD;  [facility];  Service: Endoscopy;  Laterality: N/A;  8:30    Denies any headaches, dizziness, double vision, fevers, chills, night sweats, nausea, vomiting, diarrhea, constipation, chest pain, heart palpitations, shortness of breath, blood in stool, black tarry stool, urinary pain, urinary burning, urinary frequency, hematuria.   PHYSICAL EXAMINATION  ECOG PERFORMANCE STATUS: 0 - Asymptomatic  Filed Vitals:   04/17/15 0933  BP: 126/88  Pulse: 55  Temp: 97.8 F (36.6 C)  Resp: 18    GENERAL:alert, healthy, no distress, well nourished, well developed, comfortable, cooperative and smiling SKIN: skin color, texture, turgor are normal, no rashes or significant lesions HEAD: Normocephalic, No masses, lesions, tenderness or abnormalities EYES: normal, PERRLA, EOMI, Conjunctiva are pink and non-injected EARS: External ears normal OROPHARYNX:mucous membranes are moist  NECK: supple, no adenopathy, thyroid normal size, non-tender, without nodularity, no stridor, non-tender, trachea midline LYMPH:  no palpable lymphadenopathy, no hepatosplenomegaly BREAST:not examined LUNGS: clear to auscultation and percussion HEART: regular rate & rhythm, no murmurs, no gallops, S1 normal and S2 normal ABDOMEN:abdomen soft, non-tender, normal bowel sounds, no masses or organomegaly, surgical scars noted and no hepatosplenomegaly. Easily reduced umbilical hernia BACK: Back symmetric, no curvature., No CVA tenderness EXTREMITIES:less then 2 second capillary refill, no joint deformities, effusion, or inflammation, no edema, no skin discoloration, no clubbing, no cyanosis  NEURO: alert & oriented x 3 with fluent speech, no focal motor/sensory deficits, gait normal    LABORATORY DATA: CBC    Component Value Date/Time   WBC 6.9 04/17/2015 0925   RBC 5.53 04/17/2015 0925   HGB 15.7 04/17/2015 0925   HCT 47.5 04/17/2015 0925   PLT 216 04/17/2015 0925   MCV 85.9 04/17/2015 0925    MCH 28.4 04/17/2015 0925   MCHC 33.1 04/17/2015 0925   RDW 14.2 04/17/2015 0925   LYMPHSABS 2.5 04/17/2015 0925   MONOABS 0.5 04/17/2015 0925   EOSABS 0.2 04/17/2015 0925   BASOSABS 0.1 04/17/2015 0925      Chemistry      Component Value Date/Time   NA 141 04/17/2015 0925   K 4.5 04/17/2015 0925   CL 101 04/17/2015 0925   CO2 32 04/17/2015 0925   BUN 18 04/17/2015 0925   CREATININE 0.99 04/17/2015 0925      Component Value Date/Time   CALCIUM 9.5 04/17/2015 0925   ALKPHOS 73 04/17/2015 0925   AST 27 04/17/2015 0925   ALT 36 04/17/2015 0925   BILITOT 0.5 04/17/2015 0925     Lab Results  Component Value Date   CEA 0.6 04/16/2014     RADIOGRAPHIC STUDIES:  01/09/2013  *RADIOLOGY REPORT*  Clinical Data: Carcinoma sigmoid colon. Chemotherapy in surgery  2008.  CT CHEST, ABDOMEN AND PELVIS WITH CONTRAST  Technique: Multidetector CT imaging of the chest, abdomen and  pelvis was performed following the standard protocol during bolus  administration of intravenous contrast.  Contrast: 128mL OMNIPAQUE IOHEXOL 300  MG/ML SOLN  Comparison: CT 12/29/2011  CT CHEST  Findings: No axillary or supraclavicular lymphadenopathy. No  mediastinal or hilar lymphadenopathy. No pericardial fluid.  Calcified perihilar lymph nodes. No pericardial fluid. Esophagus  is normal. There is a small 11 mm low density lesion within the  left lobe the thyroid gland which is not changed.  No suspicious pulmonary nodules.  IMPRESSION:  1. Stable exam the chest.  2. No evidence metastasis.  CT ABDOMEN AND PELVIS  Findings: There is a vague low density lesion adjacent to the  falciform ligament in the left hepatic lobe measuring 10 mm  unchanged from comparison ( image 58). There are two < 5 mm  lesions in the superior left hepatic lobe which also unchanged. No  new hepatic lesions are present. Gallbladder, pancreas, spleen,  adrenal glands, and kidneys are normal.  The stomach, small bowel,  cecum are normal. There is surgical  anastomosis in the sigmoid colon proximally without evidence of  nodularity obstruction.  There is a small umbilical hernia which contains a loop of  nonobstructed small bowel (image 88) which is similar to prior.  The left lower quadrant ostomy site is normal.  Within the pelvis, there is no free fluid or lymphadenopathy.  Bilateral fat filled inguinal hernias are present. The prostate  gland bladder normal.  IMPRESSION:  1. No evidence of colon cancer recurrence.  2. Stable anastomoses in the sigmoid colon.  3. Small umbilical hernia contains a nonobstructed loop of small  bowel.  4. Stable low density lesions within the liver.  Original Report Authenticated By: Suzy Bouchard, M.D.    ASSESSMENT:  1. Stage III adenocarcinoma of the sigmoid colon with a 4 cm cancer with 6 of 22 positive lymph nodes, grade 2, LV I was found, with surgery followed by chemotherapy consisting of oxaliplatin and capecitabine for 6 cycles. His surgery was in July 2008.  2. COPD having quit smoking 2008 after smoking 1-2 packs a day for 30 years  3. Carpal tunnel syndrome 4. Grade 1 peripheral neuropathy  5. Umbilical hernia, asymptomatic  Patient Active Problem List   Diagnosis Date Noted  . Hematochezia 11/30/2012  . Erosive esophagitis 11/30/2012  . Constipation 11/30/2012  . Adenocarcinoma of sigmoid colon 06/02/2011     PLAN:  We will continue with yearly observation. He would be NCCN guidelines with the patient and advised him that yearly follow-up is recommended at this point. He is considered cured from his colon cancer. I stressed the importance of ongoing colonoscopy.  He has changed his cell phone [phone]. We will call him with his CEA results.  I have called him in a prescription for Cymbalta for his neuropathy. I reviewed some of the data of the effectiveness of Cymbalta for treating neuropathy. We have had some success in patients with  chemotherapy-induced neuropathy. He will start 30 mg for 3-5 days and then increase to 60 mg thereafter. I have advised him to give the medication 6-8 weeks. If it works well for him he will call us and we can prescribe a 60 mg capsule.  All questions were answered. The patient knows to call the clinic with any problems, questions or concerns. We can certainly see the patient much sooner if necessary.   This document serves as a record of services personally performed by Ancil Linsey, MD. It was created on her behalf by Arlyce Harman, a trained medical scribe. The creation of this record is based on the scribe's personal observations and the  provider's statements to them. This document has been checked and approved by the attending provider. This note was signed electronically Ancil Linsey, MD

## 2015-04-17 ENCOUNTER — Ambulatory Visit (HOSPITAL_COMMUNITY): Admitting: Hematology & Oncology

## 2015-04-17 ENCOUNTER — Encounter (HOSPITAL_BASED_OUTPATIENT_CLINIC_OR_DEPARTMENT_OTHER): Payer: Federal, State, Local not specified - PPO | Admitting: Hematology & Oncology

## 2015-04-17 ENCOUNTER — Encounter (HOSPITAL_COMMUNITY): Payer: Self-pay | Admitting: Hematology & Oncology

## 2015-04-17 ENCOUNTER — Encounter (HOSPITAL_COMMUNITY): Payer: Federal, State, Local not specified - PPO | Attending: Hematology & Oncology

## 2015-04-17 VITALS — BP 126/88 | HR 55 | Temp 97.8°F | Resp 18 | Wt 209.0 lb

## 2015-04-17 DIAGNOSIS — K429 Umbilical hernia without obstruction or gangrene: Secondary | ICD-10-CM | POA: Diagnosis not present

## 2015-04-17 DIAGNOSIS — C187 Malignant neoplasm of sigmoid colon: Secondary | ICD-10-CM | POA: Diagnosis present

## 2015-04-17 DIAGNOSIS — Z87891 Personal history of nicotine dependence: Secondary | ICD-10-CM | POA: Diagnosis not present

## 2015-04-17 DIAGNOSIS — G622 Polyneuropathy due to other toxic agents: Secondary | ICD-10-CM | POA: Diagnosis not present

## 2015-04-17 DIAGNOSIS — J449 Chronic obstructive pulmonary disease, unspecified: Secondary | ICD-10-CM | POA: Diagnosis not present

## 2015-04-17 DIAGNOSIS — Z85038 Personal history of other malignant neoplasm of large intestine: Secondary | ICD-10-CM

## 2015-04-17 LAB — CBC WITH DIFFERENTIAL/PLATELET
BASOS ABS: 0.1 10*3/uL (ref 0.0–0.1)
Basophils Relative: 1 % (ref 0–1)
EOS PCT: 4 % (ref 0–5)
Eosinophils Absolute: 0.2 10*3/uL (ref 0.0–0.7)
HCT: 47.5 % (ref 39.0–52.0)
Hemoglobin: 15.7 g/dL (ref 13.0–17.0)
LYMPHS PCT: 36 % (ref 12–46)
Lymphs Abs: 2.5 10*3/uL (ref 0.7–4.0)
MCH: 28.4 pg (ref 26.0–34.0)
MCHC: 33.1 g/dL (ref 30.0–36.0)
MCV: 85.9 fL (ref 78.0–100.0)
Monocytes Absolute: 0.5 10*3/uL (ref 0.1–1.0)
Monocytes Relative: 8 % (ref 3–12)
Neutro Abs: 3.6 10*3/uL (ref 1.7–7.7)
Neutrophils Relative %: 51 % (ref 43–77)
PLATELETS: 216 10*3/uL (ref 150–400)
RBC: 5.53 MIL/uL (ref 4.22–5.81)
RDW: 14.2 % (ref 11.5–15.5)
WBC: 6.9 10*3/uL (ref 4.0–10.5)

## 2015-04-17 LAB — COMPREHENSIVE METABOLIC PANEL
ALT: 36 U/L (ref 17–63)
AST: 27 U/L (ref 15–41)
Albumin: 4.5 g/dL (ref 3.5–5.0)
Alkaline Phosphatase: 73 U/L (ref 38–126)
Anion gap: 8 (ref 5–15)
BILIRUBIN TOTAL: 0.5 mg/dL (ref 0.3–1.2)
BUN: 18 mg/dL (ref 6–20)
CALCIUM: 9.5 mg/dL (ref 8.9–10.3)
CO2: 32 mmol/L (ref 22–32)
CREATININE: 0.99 mg/dL (ref 0.61–1.24)
Chloride: 101 mmol/L (ref 101–111)
GFR calc Af Amer: >60 mL/min (ref >60)
Glucose, Bld: 102 mg/dL — ABNORMAL HIGH (ref 70–99)
Potassium: 4.5 mmol/L (ref 3.5–5.1)
SODIUM: 141 mmol/L (ref 135–145)
TOTAL PROTEIN: 7.2 g/dL (ref 6.5–8.1)

## 2015-04-17 MED ORDER — DULOXETINE HCL 30 MG PO CPEP: ORAL_CAPSULE | ORAL | Status: DC

## 2015-04-17 NOTE — Patient Instructions (Signed)
..  [facility] Discharge Instructions  RECOMMENDATIONS MADE BY THE CONSULTANT AND ANY TEST RESULTS WILL BE SENT TO YOUR REFERRING PHYSICIAN.  Exam per Dr. Whitney Muse Return in one year   Call with questions are concerns  Thank you for choosing [place] at [facility] to provide your oncology and hematology care.  To afford each patient quality time with our provider, please arrive at least 15 minutes before your scheduled appointment time.    You need to re-schedule your appointment should you arrive 10 or more minutes late.  We strive to give you quality time with our providers, and arriving late affects you and other patients whose appointments are after yours.  Also, if you no show three or more times for appointments you may be dismissed from the clinic at the providers discretion.     Again, thank you for choosing [facility].  Our hope is that these requests will decrease the amount of time that you wait before being seen by our physicians.       _____________________________________________________________  Should you have questions after your visit to [facility], please contact our office at [phone] between the hours of 8:30 a.m. and 4:30 p.m.  Voicemails left after 4:30 p.m. will not be returned until the following business day.  For prescription refill requests, have your pharmacy contact our office.

## 2015-04-18 LAB — CEA: CEA: 1.2 ng/mL (ref 0.0–4.7)

## 2015-06-07 ENCOUNTER — Telehealth (HOSPITAL_COMMUNITY): Payer: Self-pay | Admitting: Emergency Medicine

## 2015-06-07 NOTE — Telephone Encounter (Signed)
Pt called and states that he is having bad dreams since he started taking cymbalta.  Spoke with Kirby Crigler and told pt that he could stop taking the medication if he thought the dreams were too bad. Verbalized understanding

## 2015-12-03 ENCOUNTER — Telehealth: Payer: Self-pay | Admitting: *Deleted

## 2015-12-03 NOTE — Telephone Encounter (Signed)
I understand the routing process of this message, but this is not an oncology issue.  He is a colon cancer survivor of ours that was last seen in May 2016 with annual follow-up (May 2017).  Since this message was routed to me, can we call the patient and get a better triage of this message.  If he is symptomatic, he should report to ED.  If he is asymptomatic, he should get in touch with his primary care provider for work-up or appropriate referral.  Rion Catala, PA-C 12/03/2015 12:08 PM

## 2015-12-03 NOTE — Telephone Encounter (Signed)
Call received from patient requesting "Stacy RN for a cardiac referral.  I do not want [place] working on me.  I have a mitral valve regurgitation after minor chest pain I had EKG and heart sonogram.  I feel a flutter every now and then.  [facility] I may need a heart catheterization if i have chest pain."  This [facility] triage nurse advised he has not reached correct office in [place].  Call transferred to central operators and advised he go to nearest ED if any chest pain and he may need to contact PCP Dr. Raynelle Jan for cardiac referral.

## 2015-12-05 NOTE — Telephone Encounter (Signed)
Spoke with Marguerite Olea, and his nurse at the [place] is suppose to be contacting cardiology to determine what they need to send in order for a referral to be made.  He will contact us if he requires our assistance.

## 2015-12-27 ENCOUNTER — Encounter: Payer: Self-pay | Admitting: Cardiology

## 2015-12-27 ENCOUNTER — Ambulatory Visit (INDEPENDENT_AMBULATORY_CARE_PROVIDER_SITE_OTHER): Payer: Federal, State, Local not specified - PPO | Admitting: Cardiology

## 2015-12-27 VITALS — BP 132/78 | HR 80 | Ht 70.0 in | Wt 216.0 lb

## 2015-12-27 DIAGNOSIS — Z136 Encounter for screening for cardiovascular disorders: Secondary | ICD-10-CM | POA: Diagnosis not present

## 2015-12-27 DIAGNOSIS — R0789 Other chest pain: Secondary | ICD-10-CM | POA: Diagnosis not present

## 2015-12-27 NOTE — Progress Notes (Signed)
Patient ID: [name], male   DOB: [birth year], 55 y.o.   MRN: [id number]     Clinical Summary Mr. Bigman is a 55 y.o.male seen today as a new patient for the following medical problems.   1. Chest pain - started about 6 months ago. Burning pain 5-6/10. Mainly occurs at rest. + palpitations, can feel lightheaded. Not positional. No relation to food. Lasts just a few minutes. Occurs several times a day.  - runs regularly up to 10 miles every Saturday without significant troubles - denies any LE edema, no orthopnea. - drinks bottles of Dr Malachi Bonds x 2-3, no coffee, no energy drinks, no EtoH, just occasional tea - 1/22016 [place] Echo: LVEF 57%, no WMAs CAD risk factors: father MI 10, hyperlipidemia, former tobacco x 30 years.    2. 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 Adenocarcinoma of sigmoid colon 2008  . Hypokalemia   . Umbilical hernia   . Carpal tunnel syndrome      Allergies  Allergen Reactions  . Codeine Nausea And Vomiting     Current Outpatient Prescriptions  Medication Sig Dispense Refill  . DULoxetine (CYMBALTA) 30 MG capsule Take one capsule for 3 days then increase to two thereafter 60 capsule 6  . pantoprazole (PROTONIX) 40 MG tablet Take 1 tablet (40 mg total) by mouth daily. 120 tablet 0  . pregabalin (LYRICA) 75 MG capsule Take 75 mg by mouth as needed. Foot Pain    . VITAMIN D, CHOLECALCIFEROL, PO Take 5,000 Units by mouth 2 (two) times a week.     No current facility-administered medications for this visit.     Past Surgical History  Procedure Laterality Date  . Colon resection    . Appendectomy  1982  . Carpal tunnel release    . Colonoscopy  12/30/2009    Dr. Gala Romney- s/p segmental L colon resection with anastomosis identified at 22cm. no adenomatous change or malignancy identified.   . Flexible sigmoidoscopy    07/08/2007    RMR: Lobulated 1 cm rectal polyp at 10 cm,  resected as described above/ Multiple distal sigmoid diminutive polyps ablated with the tip of hot snare cautery unit/Obstructing apple core neoplasm beginning at 35 cm from the anal verge, biopsied multiple times  . Colonoscopy   01/11/2008    RMR:  Colonic mucosa from the colostomy to the cecum appeared normal.  . Colonoscopy   07/09/2008    RMR: Normal rectum, surgical anastomosis was 22 cm, polypoid tissue at anastomosis that was a recurrent neoplasm, it was removed with hot snare.  The remainder of residual colon appeared normal.  Relative poor prep on the right side made exam more challenging.  . Esophagogastroduodenoscopy  08/11/12    Rourk-erosive reflux esophagitis, distal esophageal ring/superimposed stricture s/p 93F Maloney, hiatal hernia, gastritis/duodenitis secondary to NSAIDs. Biopsy benign.  . Colonoscopy  12/16/2012    Procedure: COLONOSCOPY;  Surgeon: Daneil Dolin, MD;  [facility];  Service: Endoscopy;  Laterality: N/A;  8:30     Allergies  Allergen Reactions  . Codeine Nausea And Vomiting      Family History  Problem Relation Age of Onset  . Emphysema Father   . Heart attack Father      Social History Mr. Kint reports that he has quit smoking. He has never used smokeless tobacco. Mr. Dicus reports that he does not drink alcohol.   Review  of Systems CONSTITUTIONAL: No weight loss, fever, chills, weakness or fatigue.  HEENT: Eyes: No visual loss, blurred vision, double vision or yellow sclerae.No hearing loss, sneezing, congestion, runny nose or sore throat.  SKIN: No rash or itching.  CARDIOVASCULAR: per hpi RESPIRATORY: No shortness of breath, cough or sputum.  GASTROINTESTINAL: No anorexia, nausea, vomiting or diarrhea. No abdominal pain or blood.  GENITOURINARY: No burning on urination, no polyuria NEUROLOGICAL: No headache, dizziness, syncope, paralysis, ataxia, numbness or tingling in the extremities. No change in bowel or bladder  control.  MUSCULOSKELETAL: No muscle, back pain, joint pain or stiffness.  LYMPHATICS: No enlarged nodes. No history of splenectomy.  PSYCHIATRIC: No history of depression or anxiety.  ENDOCRINOLOGIC: No reports of sweating, cold or heat intolerance. No polyuria or polydipsia.  Marland Kitchen   Physical Examination Filed Vitals:   12/27/15 0817  BP: 132/78  Pulse: 80   Filed Vitals:   12/27/15 0817  Height: 5\' 10"  (1.778 m)  Weight: 216 lb (97.977 kg)    Gen: resting comfortably, no acute distress HEENT: no scleral icterus, pupils equal round and reactive, no palptable cervical adenopathy,  CV: RRR, no m/r/g, no jvd Resp: Clear to auscultation bilaterally GI: abdomen is soft, non-tender, non-distended, normal bowel sounds, no hepatosplenomegaly MSK: extremities are warm, no edema.  Skin: warm, no rash Neuro:  no focal deficits Psych: appropriate affect     Assessment and Plan   1. Chest pain - unclear etiology, he does have multiple CAD risk factors - he reports he has a stress test coming up through the [place]. He will have f/u through the [place] for now, however will contact us if he decides to pursue further management outside the [place] system   F/u as needed    Arnoldo Lenis, M.D.

## 2015-12-27 NOTE — Patient Instructions (Signed)
Your physician recommends that you schedule a follow-up appointment in: as needed   Thank you for choosing [facility] !         

## 2016-04-14 ENCOUNTER — Encounter (HOSPITAL_COMMUNITY): Payer: Federal, State, Local not specified - PPO

## 2016-04-14 ENCOUNTER — Encounter (HOSPITAL_COMMUNITY): Payer: Federal, State, Local not specified - PPO | Attending: Hematology & Oncology | Admitting: Hematology & Oncology

## 2016-04-14 ENCOUNTER — Encounter (HOSPITAL_COMMUNITY): Payer: Self-pay | Admitting: Hematology & Oncology

## 2016-04-14 ENCOUNTER — Ambulatory Visit (HOSPITAL_COMMUNITY)
Admission: RE | Admit: 2016-04-14 | Discharge: 2016-04-14 | Disposition: A | Discharge status: Home / Self Care | Payer: Federal, State, Local not specified - PPO | Source: Ambulatory Visit | Attending: Hematology & Oncology | Admitting: Hematology & Oncology

## 2016-04-14 VITALS — BP 106/82 | HR 70 | Temp 97.9°F | Resp 16 | Wt 214.3 lb

## 2016-04-14 DIAGNOSIS — R198 Other specified symptoms and signs involving the digestive system and abdomen: Secondary | ICD-10-CM

## 2016-04-14 DIAGNOSIS — Z9889 Other specified postprocedural states: Secondary | ICD-10-CM | POA: Insufficient documentation

## 2016-04-14 DIAGNOSIS — K429 Umbilical hernia without obstruction or gangrene: Secondary | ICD-10-CM | POA: Insufficient documentation

## 2016-04-14 DIAGNOSIS — K5900 Constipation, unspecified: Secondary | ICD-10-CM | POA: Diagnosis not present

## 2016-04-14 DIAGNOSIS — R1033 Periumbilical pain: Secondary | ICD-10-CM

## 2016-04-14 DIAGNOSIS — E876 Hypokalemia: Secondary | ICD-10-CM | POA: Insufficient documentation

## 2016-04-14 DIAGNOSIS — G5600 Carpal tunnel syndrome, unspecified upper limb: Secondary | ICD-10-CM | POA: Diagnosis not present

## 2016-04-14 DIAGNOSIS — Z87891 Personal history of nicotine dependence: Secondary | ICD-10-CM | POA: Insufficient documentation

## 2016-04-14 DIAGNOSIS — Z9221 Personal history of antineoplastic chemotherapy: Secondary | ICD-10-CM | POA: Diagnosis not present

## 2016-04-14 DIAGNOSIS — J449 Chronic obstructive pulmonary disease, unspecified: Secondary | ICD-10-CM | POA: Insufficient documentation

## 2016-04-14 DIAGNOSIS — K921 Melena: Secondary | ICD-10-CM | POA: Diagnosis not present

## 2016-04-14 DIAGNOSIS — K2210 Ulcer of esophagus without bleeding: Secondary | ICD-10-CM | POA: Diagnosis not present

## 2016-04-14 DIAGNOSIS — C187 Malignant neoplasm of sigmoid colon: Secondary | ICD-10-CM

## 2016-04-14 DIAGNOSIS — Z85038 Personal history of other malignant neoplasm of large intestine: Secondary | ICD-10-CM

## 2016-04-14 LAB — CBC WITH DIFFERENTIAL/PLATELET
BASOS PCT: 0 %
Basophils Absolute: 0 10*3/uL (ref 0.0–0.1)
EOS ABS: 0.2 10*3/uL (ref 0.0–0.7)
Eosinophils Relative: 2 %
HEMATOCRIT: 44.9 % (ref 39.0–52.0)
HEMOGLOBIN: 15 g/dL (ref 13.0–17.0)
LYMPHS ABS: 3 10*3/uL (ref 0.7–4.0)
Lymphocytes Relative: 33 %
MCH: 28.2 pg (ref 26.0–34.0)
MCHC: 33.4 g/dL (ref 30.0–36.0)
MCV: 84.4 fL (ref 78.0–100.0)
MONO ABS: 0.7 10*3/uL (ref 0.1–1.0)
MONOS PCT: 8 %
Neutro Abs: 5 10*3/uL (ref 1.7–7.7)
Neutrophils Relative %: 57 %
Platelets: 200 10*3/uL (ref 150–400)
RBC: 5.32 MIL/uL (ref 4.22–5.81)
RDW: 14.5 % (ref 11.5–15.5)
WBC: 8.9 10*3/uL (ref 4.0–10.5)

## 2016-04-14 LAB — COMPREHENSIVE METABOLIC PANEL
ALK PHOS: 75 U/L (ref 38–126)
ALT: 27 U/L (ref 17–63)
ANION GAP: 7 (ref 5–15)
AST: 21 U/L (ref 15–41)
Albumin: 4.2 g/dL (ref 3.5–5.0)
BILIRUBIN TOTAL: 0.5 mg/dL (ref 0.3–1.2)
BUN: 22 mg/dL — ABNORMAL HIGH (ref 6–20)
CO2: 27 mmol/L (ref 22–32)
Calcium: 8.7 mg/dL — ABNORMAL LOW (ref 8.9–10.3)
Chloride: 104 mmol/L (ref 101–111)
Creatinine, Ser: 0.91 mg/dL (ref 0.61–1.24)
GFR calc non Af Amer: >60 mL/min (ref >60)
GLUCOSE: 108 mg/dL — AB (ref 65–99)
POTASSIUM: 3.9 mmol/L (ref 3.5–5.1)
SODIUM: 138 mmol/L (ref 135–145)
Total Protein: 6.9 g/dL (ref 6.5–8.1)

## 2016-04-14 MED ORDER — IOPAMIDOL (ISOVUE-300) INJECTION 61%
100.0000 mL | Freq: Once | INTRAVENOUS | Status: AC | PRN
  Administered 2016-04-14: 100 mL via INTRAVENOUS

## 2016-04-14 MED ORDER — DIATRIZOATE MEGLUMINE & SODIUM 66-10 % PO SOLN
ORAL | Status: AC
  Administered 2016-04-14: 30 mL
  Filled 2016-04-14: qty 30

## 2016-04-14 NOTE — Progress Notes (Signed)
Called [place] pathology to order MSI IHC on his 2008 tumor block.    Next colonoscopy with rourk is 2019

## 2016-04-14 NOTE — Progress Notes (Signed)
[name] at [place], MD [address] [place] [address]  No diagnosis found.  CURRENT THERAPY: Surveillance per NCCN guidelines  INTERVAL HISTORY: [name] 55 y.o. 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 His surgery was in July 2008.   [name] returns to the [facility] unaccompanied. He had stage III colon cancer in 2008, and is almost 10 years out at this point.  He says he's been doing okay, just "getting old."  His only complaint is some recent abdominal pain. He notes that he has a hernia near the center of his abdomen, but notes that the pain in his upper abdominal area started hurting Sunday. He notes that his pain yesterday was about an 8. He also reports intermittent worsening of constipation. He notes nothing else out of the ordinary, and when asked if he's had pain like this before, he says it happened "probably a few years ago."  He denies nausea or vomiting. No fever.   He notes that the pain is heavy, pressure; not "constant" but pressure. He says he still runs every day; that he "can run 10 miles."   He denies any significant weight loss, no change in energy level. He is due for repeat colonoscopy in 2019 with Dr. Sydell Axon.      Adenocarcinoma of sigmoid colon   07/08/2007 Surgery Sigmoid colectomy- invasive, moderately differentiated adenocarcinoma, with invasion into the pericolonic adipose tissue and focally extyends to serousal surface. 6/22 lymph nodes.  Max tumor size was 4.0 cm.   08/25/2007 - 12/16/2007 Chemotherapy Oxaliplatin + Xeloda 1800 mg BID x 14 days    I personally reviewed and went over laboratory results with the patient.  The results are noted within this  dictation.  NCCN guidelines for surveillance for T3/T4, N0-2, M0 colon cancer are:   A. H+P every 3-6 months x 2 years and then every 6 months for a total of 5 years   B. CEA every 3 months x 2 years and then every 6 months for a total of 5 years   C. CT abd/pelvis annually for up to 5 years   D. Colonoscopy in 1 year except if no preoperative colonoscopy due to obstructing lesion, colonoscopy in 3-6 months.    1. If advanced adenoma, repeat in 1 year   2. If no advanced adenoma, repeat in 3 years, then every 5 years  E. PET scan not routinely recommended.  He has completed 5 years worth of surveillance that is recommended by the NCCN guidelines. His risk of recurrence is significantly less now that he is 5 years out.    Oncologically, he denies any complaints and ROS questioning is negative.   Past Medical History  Diagnosis Date  . Adenocarcinoma of sigmoid colon 2008  . Hypokalemia   . Umbilical hernia   . Carpal tunnel syndrome     has Adenocarcinoma of sigmoid colon; Hematochezia; Erosive esophagitis; and Constipation on his problem list.     is allergic to codeine.  [name] had no medications administered during this visit.  Past Surgical History  Procedure Laterality Date  . Colon resection    . Appendectomy  1982  . Carpal tunnel release    .  Colonoscopy  12/30/2009    Dr. Gala Romney- s/p segmental L colon resection with anastomosis identified at 22cm. no adenomatous change or malignancy identified.   . Flexible sigmoidoscopy    07/08/2007    RMR: Lobulated 1 cm rectal polyp at 10 cm, resected as described above/ Multiple distal sigmoid diminutive polyps ablated with the tip of hot snare cautery unit/Obstructing apple core neoplasm beginning at 35 cm from the anal verge, biopsied multiple times  . Colonoscopy   01/11/2008    RMR:  Colonic mucosa from the colostomy to the cecum appeared normal.  . Colonoscopy   07/09/2008    RMR: Normal rectum, surgical anastomosis was  22 cm, polypoid tissue at anastomosis that was a recurrent neoplasm, it was removed with hot snare.  The remainder of residual colon appeared normal.  Relative poor prep on the right side made exam more challenging.  . Esophagogastroduodenoscopy  08/11/12    Rourk-erosive reflux esophagitis, distal esophageal ring/superimposed stricture s/p 38F Maloney, hiatal hernia, gastritis/duodenitis secondary to NSAIDs. Biopsy benign.  . Colonoscopy  12/16/2012    Procedure: COLONOSCOPY;  Surgeon: Daneil Dolin, MD;  [facility];  Service: Endoscopy;  Laterality: N/A;  8:30    Denies any headaches, dizziness, double vision, fevers, chills, night sweats, nausea, vomiting, diarrhea, constipation, chest pain, heart palpitations, shortness of breath, blood in stool, black tarry stool, urinary pain, urinary burning, urinary frequency, hematuria.   14 point review of systems was performed and is negative except as detailed under history of present illness and above   PHYSICAL EXAMINATION  ECOG PERFORMANCE STATUS: 0 - Asymptomatic  Filed Vitals:   04/14/16 1000  BP: 106/82  Pulse: 70  Temp: 97.9 F (36.6 C)  Resp: 16    GENERAL:alert, healthy, no distress, well nourished, well developed, comfortable, cooperative and smiling SKIN: skin color, texture, turgor are normal, no rashes or significant lesions HEAD: Normocephalic, No masses, lesions, tenderness or abnormalities EYES: normal, PERRLA, EOMI, Conjunctiva are pink and non-injected EARS: External ears normal OROPHARYNX:mucous membranes are moist  NECK: supple, no adenopathy, thyroid normal size, non-tender, without nodularity, no stridor, non-tender, trachea midline LYMPH:  no palpable lymphadenopathy, no hepatosplenomegaly BREAST:not examined LUNGS: clear to auscultation and percussion HEART: regular rate & rhythm, no murmurs, no gallops, S1 normal and S2 normal ABDOMEN:abdomen soft, normal bowel sounds, no masses or organomegaly,  surgical scars noted and no hepatosplenomegaly. Easily reduced umbilical hernia  Tender when palpated in the upper-central-midline. BACK: Back symmetric, no curvature., No CVA tenderness EXTREMITIES:less then 2 second capillary refill, no joint deformities, effusion, or inflammation, no edema, no skin discoloration, no clubbing, no cyanosis  NEURO: alert & oriented x 3 with fluent speech, no focal motor/sensory deficits, gait normal   LABORATORY DATA: I have reviewed the data as listed.  CBC    Component Value Date/Time   WBC 8.9 04/14/2016 0927   RBC 5.32 04/14/2016 0927   HGB 15.0 04/14/2016 0927   HCT 44.9 04/14/2016 0927   PLT 200 04/14/2016 0927   MCV 84.4 04/14/2016 0927   MCH 28.2 04/14/2016 0927   MCHC 33.4 04/14/2016 0927   RDW 14.5 04/14/2016 0927   LYMPHSABS 3.0 04/14/2016 0927   MONOABS 0.7 04/14/2016 0927   EOSABS 0.2 04/14/2016 0927   BASOSABS 0.0 04/14/2016 0927      Chemistry      Component Value Date/Time   NA 138 04/14/2016 0927   K 3.9 04/14/2016 0927   CL 104 04/14/2016 0927   CO2 27  04/14/2016 0927   BUN 22* 04/14/2016 0927   CREATININE 0.91 04/14/2016 0927      Component Value Date/Time   CALCIUM 8.7* 04/14/2016 0927   ALKPHOS 75 04/14/2016 0927   AST 21 04/14/2016 0927   ALT 27 04/14/2016 0927   BILITOT 0.5 04/14/2016 9563     Lab Results  Component Value Date   CEA 1.2 04/17/2015     RADIOGRAPHIC STUDIES:  01/09/2013  *RADIOLOGY REPORT*  Clinical Data: Carcinoma sigmoid colon. Chemotherapy in surgery  2008.  CT CHEST, ABDOMEN AND PELVIS WITH CONTRAST  Technique: Multidetector CT imaging of the chest, abdomen and  pelvis was performed following the standard protocol during bolus  administration of intravenous contrast.  Contrast: 190m OMNIPAQUE IOHEXOL 300 MG/ML SOLN  Comparison: CT 12/29/2011  CT CHEST  Findings: No axillary or supraclavicular lymphadenopathy. No  mediastinal or hilar lymphadenopathy. No pericardial fluid.    Calcified perihilar lymph nodes. No pericardial fluid. Esophagus  is normal. There is a small 11 mm low density lesion within the  left lobe the thyroid gland which is not changed.  No suspicious pulmonary nodules.  IMPRESSION:  1. Stable exam the chest.  2. No evidence metastasis.  CT ABDOMEN AND PELVIS  Findings: There is a vague low density lesion adjacent to the  falciform ligament in the left hepatic lobe measuring 10 mm  unchanged from comparison ( image 58). There are two < 5 mm  lesions in the superior left hepatic lobe which also unchanged. No  new hepatic lesions are present. Gallbladder, pancreas, spleen,  adrenal glands, and kidneys are normal.  The stomach, small bowel, cecum are normal. There is surgical  anastomosis in the sigmoid colon proximally without evidence of  nodularity obstruction.  There is a small umbilical hernia which contains a loop of  nonobstructed small bowel (image 88) which is similar to prior.  The left lower quadrant ostomy site is normal.  Within the pelvis, there is no free fluid or lymphadenopathy.  Bilateral fat filled inguinal hernias are present. The prostate  gland bladder normal.  IMPRESSION:  1. No evidence of colon cancer recurrence.  2. Stable anastomoses in the sigmoid colon.  3. Small umbilical hernia contains a nonobstructed loop of small  bowel.  4. Stable low density lesions within the liver.  Original Report Authenticated By: SSuzy Bouchard M.D.    ASSESSMENT:  1. Stage III adenocarcinoma of the sigmoid colon with a 4 cm cancer with 6 of 22 positive lymph nodes, grade 2, LV I was found, with surgery followed by chemotherapy consisting of oxaliplatin and capecitabine for 6 cycles. His surgery was in July 2008.  2. COPD having quit smoking 2008 after smoking 1-2 packs a day for 30 years  3. Umbilical hernia, symptomatic  Patient Active Problem List   Diagnosis Date Noted  . Hematochezia 11/30/2012  . Erosive esophagitis  11/30/2012  . Constipation 11/30/2012  . Adenocarcinoma of sigmoid colon 06/02/2011    PLAN:  Unfortunately he is developing symptoms from his umbilical hernia. He has had the onset of periumbilical pain, constipation and his exam reveals abdominal tenderness. I have ordered a CT of the abdomen today. He is agreeable. Additional recommendations will be made pending the results.   We will continue with yearly observation. He would be NCCN guidelines with the patient and advised him that yearly follow-up is recommended at this point. He is considered cured from his colon cancer. I stressed the importance of ongoing colonoscopy.  We will  call him with his CEA results.  Note that MMR status was not done on his tumor block, we are going to add this today. If necessary based upon the results we will refer him to genetics.   NCCN guidelines for surveillance for Colon cancer are as follows (1.2017):  B. Stage II, Stage III 1. H+P every 3-6 months x 2 years and then every 6 months for a total of 5 years  2. CEA every 3-6 months x 2 years and then every 6 months for a total of 5 years  3. CT CAP every 6-12 months (category 2B for frequency < 12 months) for a total of 5 years . 4.  Colonoscopy in 1 year except if no preoperative colonoscopy due to obstructing lesion, colonoscopy in 3-6 months.  A. If advanced adenoma, repeat in 1 year B. If no advanced adenoma, repeat in 3 years, then every 5 years 5. PET/CT scan is not recommended.  All questions were answered. The patient knows to call the clinic with any problems, questions or concerns. We can certainly see the patient much sooner if necessary.  This document serves as a record of services personally performed by Ancil Linsey, MD. It was created on her behalf by Toni Amend, a trained medical scribe. The creation of this record is based on the scribe's personal observations and the provider's statements to them. This document has been  checked and approved by the attending provider.  I have reviewed the above documentation for accuracy and completeness and I agree with the above.  This note was signed electronically Ancil Linsey, MD

## 2016-04-14 NOTE — Patient Instructions (Signed)
[place] at [facility] Discharge Instructions  RECOMMENDATIONS MADE BY THE CONSULTANT AND ANY TEST RESULTS WILL BE SENT TO YOUR REFERRING PHYSICIAN.   Exam and discussion by Dr Whitney Muse today STAT CT scan today  Return to see the doctor in 1 year with labs Please call the clinic if you have any questions or concerns     Thank you for choosing [place] at [facility] to provide your oncology and hematology care.  To afford each patient quality time with our provider, please arrive at least 15 minutes before your scheduled appointment time.   Beginning January 23rd 2017 lab work for the [facility] will be done in the  Main lab at [facility] on 1st floor. If you have a lab appointment with the [facility] please come in thru the  Main Entrance and check in at the main information desk  You need to re-schedule your appointment should you arrive 10 or more minutes late.  We strive to give you quality time with our providers, and arriving late affects you and other patients whose appointments are after yours.  Also, if you no show three or more times for appointments you may be dismissed from the clinic at the providers discretion.     Again, thank you for choosing [facility].  Our hope is that these requests will decrease the amount of time that you wait before being seen by our physicians.       _____________________________________________________________  Should you have questions after your visit to [facility], please contact our office at [phone] between the hours of 8:30 a.m. and 4:30 p.m.  Voicemails left after 4:30 p.m. will not be returned until the following business day.  For prescription refill requests, have your pharmacy contact our office.         Resources For Cancer Patients and their Caregivers ? American Cancer Society: Can assist with transportation, wigs, general needs, runs  Look Good Feel Better.        432-842-3331 ? Cancer Care: Provides financial assistance, online support groups, medication/co-pay assistance.  1-800-813-HOPE (919)802-4812) ? [facility] Assists [place] cancer patients and their families through emotional , educational and financial support.  226-542-7545 ? [place] DSS Where to apply for food stamps, Medicaid and utility assistance. (680)538-4186 ? [facility]: Transportation to medical appointments. 859 075 2002 ? Social Security Administration: May apply for disability if have a Stage IV cancer. (986)759-6762 450-567-7183 ? [employer], Disability and Transit Services: Assists with nutrition, care and transit needs. (754)748-7268

## 2016-04-15 ENCOUNTER — Encounter (HOSPITAL_COMMUNITY): Payer: Self-pay | Admitting: Lab

## 2016-04-15 LAB — CEA: CEA: 0.8 ng/mL (ref 0.0–4.7)

## 2016-04-15 NOTE — Progress Notes (Signed)
Referral sent [facility]/ Dr Zella Richer.  Records faxed on 5/3.  Their office to call him with appt.

## 2016-05-18 ENCOUNTER — Other Ambulatory Visit (HOSPITAL_COMMUNITY): Payer: Self-pay | Admitting: General Surgery

## 2016-05-18 ENCOUNTER — Ambulatory Visit (HOSPITAL_COMMUNITY)
Admission: RE | Admit: 2016-05-18 | Discharge: 2016-05-18 | Disposition: A | Discharge status: Home / Self Care | Payer: Federal, State, Local not specified - PPO | Source: Ambulatory Visit | Attending: General Surgery | Admitting: General Surgery

## 2016-05-18 ENCOUNTER — Encounter: Payer: Self-pay | Admitting: Internal Medicine

## 2016-05-18 DIAGNOSIS — R1110 Vomiting, unspecified: Secondary | ICD-10-CM | POA: Diagnosis not present

## 2016-05-18 DIAGNOSIS — R932 Abnormal findings on diagnostic imaging of liver and biliary tract: Secondary | ICD-10-CM | POA: Insufficient documentation

## 2016-05-18 DIAGNOSIS — R1013 Epigastric pain: Secondary | ICD-10-CM

## 2016-05-26 ENCOUNTER — Ambulatory Visit (INDEPENDENT_AMBULATORY_CARE_PROVIDER_SITE_OTHER): Payer: Federal, State, Local not specified - PPO | Admitting: Internal Medicine

## 2016-05-26 ENCOUNTER — Encounter: Payer: Self-pay | Admitting: Internal Medicine

## 2016-05-26 ENCOUNTER — Other Ambulatory Visit: Payer: Self-pay

## 2016-05-26 VITALS — BP 140/64 | HR 58 | Temp 98.3°F | Ht 62.0 in | Wt 209.0 lb

## 2016-05-26 DIAGNOSIS — Z85038 Personal history of other malignant neoplasm of large intestine: Secondary | ICD-10-CM

## 2016-05-26 DIAGNOSIS — K2100 Gastro-esophageal reflux disease with esophagitis, without bleeding: Secondary | ICD-10-CM

## 2016-05-26 DIAGNOSIS — R194 Change in bowel habit: Secondary | ICD-10-CM

## 2016-05-26 DIAGNOSIS — K625 Hemorrhage of anus and rectum: Secondary | ICD-10-CM

## 2016-05-26 DIAGNOSIS — K921 Melena: Secondary | ICD-10-CM

## 2016-05-26 MED ORDER — PEG 3350-KCL-NA BICARB-NACL 420 G PO SOLR: 4000.0000 mL | Freq: Once | ORAL | Status: DC

## 2016-05-26 MED ORDER — PANTOPRAZOLE SODIUM 40 MG PO TBEC: 40.0000 mg | DELAYED_RELEASE_TABLET | Freq: Every day | ORAL | Status: DC

## 2016-05-26 NOTE — Patient Instructions (Addendum)
Schedule diagnostic colonoscopy (change in bowel habits, rectal bleeding; history of colon cancer)  Split prep   Protonix 40 mg daily for reflux  GERD information provided

## 2016-05-26 NOTE — Progress Notes (Signed)
Primary Care Physician:  Michael Cloud, MD Primary Gastroenterologist:  Dr. Gala Valdez  Pre-Procedure History & Physical: HPI:  [name] is a 55 y.o. male here for further evaluation of change in bowel habits and intermittent low-volume hematochezia. 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 Last colonoscopy was in 2013 where he was found to have a small adenoma - removed. Minimal hemorrhoids. He states over the past few months, he has had a little more difficulty achieving a bowel movement and has noted scant amount of rectal bleeding from time to time. He saw Dr. Zella Valdez in [place] recently for an abdominal hernia. Found to have an incisional hernia and surgery has been recommended. However, prior to pursuing a surgical repair, he was referred to evaluate his bowel complaints.  He is now followed by Dr. Whitney Valdez as well.  He has a history of erosive reflux esophagitis seen on prior EGD without Barrett's epithelium. He was well controlled on pantoprazole 40 mg once daily up until about one year ago when the [place] switched him to omeprazole 20 mg twice daily. Since that time, he has had frequent breakthrough symptoms. No dysphagia, vomiting or hematemesis. After the initial diagnosis, has was tried on Dexilant;  this was effective in controlling his reflux symptoms but caused significant constipation.   Past Medical History  Diagnosis Date  . Adenocarcinoma of sigmoid colon 2008  . Hypokalemia   . Umbilical hernia   . Carpal tunnel syndrome     Past Surgical History  Procedure Laterality Date  . Colon resection    . Appendectomy  1982  . Carpal tunnel release    . Colonoscopy  12/30/2009    Dr. Gala Valdez- s/p segmental L colon resection with anastomosis identified at 22cm. no adenomatous change or malignancy identified.   . Flexible sigmoidoscopy    07/08/2007    RMR: Lobulated 1 cm rectal polyp at 10 cm, resected as described above/ Multiple distal  sigmoid diminutive polyps ablated with the tip of hot snare cautery unit/Obstructing apple core neoplasm beginning at 35 cm from the anal verge, biopsied multiple times  . Colonoscopy   01/11/2008    RMR:  Colonic mucosa from the colostomy to the cecum appeared normal.  . Colonoscopy   07/09/2008    RMR: Normal rectum, surgical anastomosis was 22 cm, polypoid tissue at anastomosis that was a recurrent neoplasm, it was removed with hot snare.  The remainder of residual colon appeared normal.  Relative poor prep on the right side made exam more challenging.  . Esophagogastroduodenoscopy  08/11/12    [name]-erosive reflux esophagitis, distal esophageal ring/superimposed stricture s/p 83F [place], hiatal hernia, gastritis/duodenitis secondary to NSAIDs. Biopsy benign.  . Colonoscopy  12/16/2012    Procedure: COLONOSCOPY;  Surgeon: Michael Dolin, MD;  Location: [facility];  Service: Endoscopy;  Laterality: N/A;  8:30    Prior to Admission medications   Medication Sig Start Date End Date Taking? Authorizing Provider  omeprazole (PRILOSEC) 20 MG capsule Take 20 mg by mouth 2 (two) times daily.   Yes Historical Provider, MD  simvastatin (ZOCOR) 10 MG tablet Take 10 mg by mouth daily.   Yes Historical Provider, MD  pantoprazole (PROTONIX) 40 MG tablet Take 1 tablet (40 mg total) by mouth daily. 05/26/16   Michael Dolin, MD    Allergies as of 05/26/2016 - Review Complete 05/26/2016  Allergen Reaction Noted  . Codeine Nausea And Vomiting 06/26/2011  Family History  Problem Relation Age of Onset  . Emphysema Father   . Heart attack Father     Social History   Social History  . Marital Status: Married    Spouse Name: N/A  . Number of Children: 1  . Years of Education: N/A   Occupational History  . [employer]    Social History Main Topics  . Smoking status: Former Research scientist (life sciences)  . Smokeless tobacco: Never Used  . Alcohol Use: No  . Drug Use: No  . Sexual Activity: Yes   Other Topics  Concern  . Not on file   Social History Narrative   Lives with wife & daughter          Review of Systems: See HPI, otherwise negative ROS  Physical Exam: BP 140/64 mmHg  Pulse 58  Temp(Src) 98.3 F (36.8 C) (Oral)  Ht 5\' 2"  (1.575 m)  Wt 209 lb (94.802 kg)  BMI 38.22 kg/m2 General:   Alert,  Well-developed, well-nourished, pleasant and cooperative in NAD Skin:  Intact without significant lesions or rashes. Eyes:  Sclera clear, no icterus.   Conjunctiva pink. Neck:  Supple; no masses or thyromegaly. No significant cervical adenopathy. Lungs:  Clear throughout to auscultation.   No wheezes, crackles, or rhonchi. No acute distress. Heart:  Regular rate and rhythm; no murmurs, clicks, rubs,  or gallops. Abdomen:  Nondistended. Vertical midline surgical scar.  Incisional hernia appreciated superior to the umbilicus - easily reducible.  Abdomen is soft and non-tender without without appreciable mass or organomegaly..  Pulses:  Normal pulses noted. Extremities:  Without clubbing or edema.  Impression:  Pleasant 55 year old gentleman with a history stage III colon cancer -  status post sigmoid colectomy in 2008.  He has done very well until recently.  He's noted some nonspecific symptoms including a tendency towards constipation and scant rectal bleeding. Colonoscopy in 2013 as outlined above. Plans now being made for repair for symptomatic incisional hernia.  I agree with Dr. Zella Valdez, his bowel complaints deserve further investigation prior to hernia repair.  As a separate issue, he has a history of erosive reflux esophagitis. Symptoms have  been well controlled historically on pantoprazole 40 mg daily. However, the [facility] change to his regimen to omeprazole 20 mg twice daily about a year ago. Since that time, he has experience frequent breakthrough symptoms without any alarm features.   Recommendations:  I have offered the patient a diagnostic colonoscopy in the near future.  The  risks, benefits, limitations, alternatives and imponderables have been reviewed with the patient. Questions have been answered. All parties are agreeable.  Will start him back on Protonix 40 mg daily in lieu of omeprazole. If his reflux symptoms don't settle down on this regimen, he would need further evaluation.  Further recommendations to follow after colonoscopy has been performed. The risks, benefits, limitations, alternatives and imponderables have been reviewed with the patient. Questions have been answered. All parties are agreeable.   Split prep   Protonix 40 mg daily for reflux  GERD information provided    Notice: This dictation was prepared with Dragon dictation along with smaller phrase technology. Any transcriptional errors that result from this process are unintentional and may not be corrected upon review.

## 2016-06-01 ENCOUNTER — Other Ambulatory Visit: Payer: Self-pay

## 2016-06-01 ENCOUNTER — Telehealth: Payer: Self-pay | Admitting: Internal Medicine

## 2016-06-01 MED ORDER — PEG 3350-KCL-NA BICARB-NACL 420 G PO SOLR: 4000.0000 mL | Freq: Once | ORAL | Status: DC

## 2016-06-01 NOTE — Telephone Encounter (Signed)
Pt is scheduled for colonoscopy on Wednesday with [facility] and [facility] [place] does not have trilyte prep and can not get it in time for him to start drinking. He asked for [place] to call it in to [facility] in [place] on [address]. He also wanted RMR to know that the protonix was not helping and he started taking omeprazole again because he is having burning in his chest. He said if there's any questions to call his cell [phone] because he was going to the [place] today and would be on the road.

## 2016-06-01 NOTE — Telephone Encounter (Signed)
Rx sent to [facility].

## 2016-06-01 NOTE — Telephone Encounter (Signed)
Pt is aware.  

## 2016-06-03 ENCOUNTER — Ambulatory Visit (HOSPITAL_COMMUNITY)
Admission: RE | Admit: 2016-06-03 | Discharge: 2016-06-03 | Disposition: A | Discharge status: Home / Self Care | Payer: Federal, State, Local not specified - PPO | Source: Ambulatory Visit | Attending: Internal Medicine | Admitting: Internal Medicine

## 2016-06-03 ENCOUNTER — Encounter (HOSPITAL_COMMUNITY): Payer: Self-pay | Admitting: *Deleted

## 2016-06-03 ENCOUNTER — Encounter (HOSPITAL_COMMUNITY)
Admission: RE | Disposition: A | Discharge status: Home / Self Care | Payer: Self-pay | Source: Ambulatory Visit | Attending: Internal Medicine

## 2016-06-03 DIAGNOSIS — K640 First degree hemorrhoids: Secondary | ICD-10-CM | POA: Insufficient documentation

## 2016-06-03 DIAGNOSIS — E876 Hypokalemia: Secondary | ICD-10-CM | POA: Insufficient documentation

## 2016-06-03 DIAGNOSIS — Z9049 Acquired absence of other specified parts of digestive tract: Secondary | ICD-10-CM | POA: Insufficient documentation

## 2016-06-03 DIAGNOSIS — R194 Change in bowel habit: Secondary | ICD-10-CM

## 2016-06-03 DIAGNOSIS — Z87891 Personal history of nicotine dependence: Secondary | ICD-10-CM | POA: Insufficient documentation

## 2016-06-03 DIAGNOSIS — K921 Melena: Secondary | ICD-10-CM | POA: Insufficient documentation

## 2016-06-03 DIAGNOSIS — Z85038 Personal history of other malignant neoplasm of large intestine: Secondary | ICD-10-CM | POA: Insufficient documentation

## 2016-06-03 DIAGNOSIS — K625 Hemorrhage of anus and rectum: Secondary | ICD-10-CM

## 2016-06-03 DIAGNOSIS — Z79899 Other long term (current) drug therapy: Secondary | ICD-10-CM | POA: Diagnosis not present

## 2016-06-03 DIAGNOSIS — K219 Gastro-esophageal reflux disease without esophagitis: Secondary | ICD-10-CM | POA: Insufficient documentation

## 2016-06-03 DIAGNOSIS — D122 Benign neoplasm of ascending colon: Secondary | ICD-10-CM | POA: Diagnosis not present

## 2016-06-03 DIAGNOSIS — Z860100 Personal history of colon polyps, unspecified: Secondary | ICD-10-CM | POA: Insufficient documentation

## 2016-06-03 HISTORY — PX: COLONOSCOPY: SHX5424

## 2016-06-03 SURGERY — COLONOSCOPY
Anesthesia: Moderate Sedation

## 2016-06-03 MED ORDER — MIDAZOLAM HCL 5 MG/5ML IJ SOLN
INTRAMUSCULAR | Status: AC
  Filled 2016-06-03: qty 10

## 2016-06-03 MED ORDER — MEPERIDINE HCL 100 MG/ML IJ SOLN
INTRAMUSCULAR | Status: AC
  Filled 2016-06-03: qty 2

## 2016-06-03 MED ORDER — ONDANSETRON HCL 4 MG/2ML IJ SOLN
INTRAMUSCULAR | Status: AC
  Filled 2016-06-03: qty 2

## 2016-06-03 MED ORDER — MEPERIDINE HCL 100 MG/ML IJ SOLN
INTRAMUSCULAR | Status: DC | PRN
  Administered 2016-06-03 (×2): 50 mg via INTRAVENOUS

## 2016-06-03 MED ORDER — ONDANSETRON HCL 4 MG/2ML IJ SOLN
INTRAMUSCULAR | Status: DC | PRN
Start: 2016-06-03 — End: 2016-06-03
  Administered 2016-06-03: 4 mg via INTRAVENOUS

## 2016-06-03 MED ORDER — STERILE WATER FOR IRRIGATION IR SOLN
Status: DC | PRN
  Administered 2016-06-03: 15:00:00

## 2016-06-03 MED ORDER — MIDAZOLAM HCL 5 MG/5ML IJ SOLN
INTRAMUSCULAR | Status: DC | PRN
  Administered 2016-06-03 (×2): 2 mg via INTRAVENOUS

## 2016-06-03 MED ORDER — SODIUM CHLORIDE 0.9 % IV SOLN
INTRAVENOUS | Status: DC
  Administered 2016-06-03: 1000 mL via INTRAVENOUS

## 2016-06-03 NOTE — Progress Notes (Signed)
patient seen and examined. No change. Plan for colonoscopy today.You should have another colonoscopy in 10 years.The risks, benefits, limitations, alternatives and imponderables have been reviewed with the patient. Questions have been answered. All parties are agreeable.

## 2016-06-03 NOTE — Discharge Instructions (Signed)
Colonoscopy Discharge Instructions  Read the instructions outlined below and refer to this sheet in the next few weeks. These discharge instructions provide you with general information on caring for yourself after you leave the hospital. Your doctor may also give you specific instructions. While your treatment has been planned according to the most current medical practices available, unavoidable complications occasionally occur. If you have any problems or questions after discharge, call Dr. Gala Romney at [phone]. ACTIVITY  You may resume your regular activity, but move at a slower pace for the next 24 hours.   Take frequent rest periods for the next 24 hours.   Walking will help get rid of the air and reduce the bloated feeling in your belly (abdomen).   No driving for 24 hours (because of the medicine (anesthesia) used during the test).    Do not sign any important legal documents or operate any machinery for 24 hours (because of the anesthesia used during the test).  NUTRITION  Drink plenty of fluids.   You may resume your normal diet as instructed by your doctor.   Begin with a light meal and progress to your normal diet. Heavy or fried foods are harder to digest and may make you feel sick to your stomach (nauseated).   Avoid alcoholic beverages for 24 hours or as instructed.  MEDICATIONS  You may resume your normal medications unless your doctor tells you otherwise.  WHAT YOU CAN EXPECT TODAY  Some feelings of bloating in the abdomen.   Passage of more gas than usual.   Spotting of blood in your stool or on the toilet paper.  IF YOU HAD POLYPS REMOVED DURING THE COLONOSCOPY:  No aspirin products for 7 days or as instructed.   No alcohol for 7 days or as instructed.   Eat a soft diet for the next 24 hours.  FINDING OUT THE RESULTS OF YOUR TEST Not all test results are available during your visit. If your test results are not back during the visit, make an appointment  with your caregiver to find out the results. Do not assume everything is normal if you have not heard from your caregiver or the medical facility. It is important for you to follow up on all of your test results.  SEEK IMMEDIATE MEDICAL ATTENTION IF:  You have more than a spotting of blood in your stool.   Your belly is swollen (abdominal distention).   You are nauseated or vomiting.   You have a temperature over 101.   You have abdominal pain or discomfort that is severe or gets worse throughout the day.    MiraLAX 17 g orally twice daily as needed for constipation  I'm okay with you taking omeprazole 40 mg twice daily for GERD for the time being.  Colon polyp information provided  Further recommendations to follow pending review of pathology report  Okay with proceeding with hernia surgery.  Office visit with [place] in 6 weeks     Colon Polyps Polyps are lumps of extra tissue growing inside the body. Polyps can grow in the large intestine (colon). Most colon polyps are noncancerous (benign). However, some colon polyps can become cancerous over time. Polyps that are larger than a pea may be harmful. To be safe, caregivers remove and test all polyps. CAUSES  Polyps form when mutations in the genes cause your cells to grow and divide even though no more tissue is needed. RISK FACTORS There are a number of risk factors that can increase  your chances of getting colon polyps. They include:  Being older than 50 years.  Family history of colon polyps or colon cancer.  Long-term colon diseases, such as colitis or Crohn disease.  Being overweight.  Smoking.  Being inactive.  Drinking too much alcohol. SYMPTOMS  Most small polyps do not cause symptoms. If symptoms are present, they may include:  Blood in the stool. The stool may look dark red or black.  Constipation or diarrhea that lasts longer than 1 week. DIAGNOSIS People often do not know they have polyps until their  caregiver finds them during a regular checkup. Your caregiver can use 4 tests to check for polyps:  Digital rectal exam. The caregiver wears gloves and feels inside the rectum. This test would find polyps only in the rectum.  Barium enema. The caregiver puts a liquid called barium into your rectum before taking X-rays of your colon. Barium makes your colon look white. Polyps are dark, so they are easy to see in the X-ray pictures.  Sigmoidoscopy. A thin, flexible tube (sigmoidoscope) is placed into your rectum. The sigmoidoscope has a light and tiny camera in it. The caregiver uses the sigmoidoscope to look at the last third of your colon.  Colonoscopy. This test is like sigmoidoscopy, but the caregiver looks at the entire colon. This is the most common method for finding and removing polyps. TREATMENT  Any polyps will be removed during a sigmoidoscopy or colonoscopy. The polyps are then tested for cancer. PREVENTION  To help lower your risk of getting more colon polyps:  Eat plenty of fruits and vegetables. Avoid eating fatty foods.  Do not smoke.  Avoid drinking alcohol.  Exercise every day.  Lose weight if recommended by your caregiver.  Eat plenty of calcium and folate. Foods that are rich in calcium include milk, cheese, and broccoli. Foods that are rich in folate include chickpeas, kidney beans, and spinach. HOME CARE INSTRUCTIONS Keep all follow-up appointments as directed by your caregiver. You may need periodic exams to check for polyps. SEEK MEDICAL CARE IF: You notice bleeding during a bowel movement.   This information is not intended to replace advice given to you by your health care provider. Make sure you discuss any questions you have with your health care provider.   Document Released: 08/26/2004 Document Revised: 12/21/2014 Document Reviewed: 02/09/2012 Elsevier Interactive Patient Education [employer].

## 2016-06-03 NOTE — Op Note (Addendum)
[facility] Patient Name: [name] Procedure Date: 06/03/2016 3:16 PM MRN: [id number] Date of Birth: March 16, 1961 Attending MD: Norvel Richards , MD CSN: [id number] Age: 55 Admit Type: Outpatient Procedure:                Colonoscopy with snare polypectomy Indications:              Hematochezia, Change in stool caliber Providers:                Norvel Richards, MD, Hinton Rao, RN, Georgeann Oppenheim, Technician Referring MD:              Medicines:                Midazolam 6 mg IV, Meperidine 100 mg IV,                            Ondansetron 4 mg IV Complications:            No immediate complications. Estimated Blood Loss:     Estimated blood loss was minimal. Procedure:                Pre-Anesthesia Assessment:                           - Prior to the procedure, a History and Physical                            was performed, and patient medications and                            allergies were reviewed. The patient's tolerance of                            previous anesthesia was also reviewed. The risks                            and benefits of the procedure and the sedation                            options and risks were discussed with the patient.                            All questions were answered, and informed consent                            was obtained. Prior Anticoagulants: The patient has                            taken no previous anticoagulant or antiplatelet                            agents. ASA Grade Assessment: II - A patient with  mild systemic disease. After reviewing the risks                            and benefits, the patient was deemed in                            satisfactory condition to undergo the procedure.                           After obtaining informed consent, the colonoscope                            was passed under direct vision. Throughout the          procedure, the patient's blood pressure, pulse, and                            oxygen saturations were monitored continuously. The                            [id number]) scope was introduced through                            the anus and advanced to the the terminal ileum,                            with identification of the appendiceal orifice and                            IC valve. The ileocecal valve, appendiceal orifice,                            and rectum were photographed. The colonoscopy was                            performed without difficulty. The patient tolerated                            the procedure well. The quality of the bowel                            preparation was adequate. The entire colon was well                            visualized. Scope In: 3:35:10 PM Scope Out: 3:50:39 PM Scope Withdrawal Time: 0 hours 8 minutes 49 seconds  Total Procedure Duration: 0 hours 15 minutes 29 seconds  Findings:      The perianal and digital rectal examinations were normal.      There was evidence of a prior surgical anastomosis at 25 cm from anal       verge. Anastomosis appeared normal.      A 4 mm polyp was found in the ascending colon. The polyp was sessile.       The polyp was removed with a cold snare. Resection and retrieval were  complete. Estimated blood loss was minimal. The remainder of the       coloncosa appeared normal. the patient had grade 1 hemorrhoids. Impression:               - Status post prior left hemicolecomy for colon                            cancer..                           - One 4 mm polyp in the ascending colon, removed                            with a cold snare. Resected and retrieved. grade 1                            hemorrhoids. I suspect benign ano-rectal bleeding                            in the setting of constipation Moderate Sedation:      Moderate (conscious) sedation was administered by the endoscopy nurse        and supervised by the endoscopist. The following parameters were       monitored: oxygen saturation, heart rate, blood pressure, respiratory       rate, EKG, adequacy of pulmonary ventilation, and response to care.       Total physician intraservice time was 26 minutes. Recommendation:           - Patient has a contact number available for                            emergencies. The signs and symptoms of potential                            delayed complications were discussed with the                            patient. Return to normal activities tomorrow.                            Written discharge instructions were provided to the                            patient.                           - Advance diet as tolerated.                           - Continue present medications.                           - Repeat colonoscopy date to be determined after                            pending pathology results are reviewed for  surveillance based on pathology results.                           - Return to GI clinic in 6 weeks. Patient is okay                            to proceed with upcoming inguinal hernia surgery.                            Add MiraLAX twice a day to his regimen. Procedure Code(s):        --- Professional ---                           [id number], Colonoscopy, flexible; with removal of                            tumor(s), polyp(s), or other lesion(s) by snare                            technique                           99152, Moderate sedation services provided by the                            same physician or other qualified health care                            professional performing the diagnostic or                            therapeutic service that the sedation supports,                            requiring the presence of an independent trained                            observer to assist in the monitoring of the                             patient's level of consciousness and physiological                            status; initial 15 minutes of intraservice time,                            patient age 64 years or older                           [id number], Moderate sedation services; each additional                            15 minutes intraservice time Diagnosis Code(s):        --- Professional ---  Z98.0, Intestinal bypass and anastomosis status                           D12.2, Benign neoplasm of ascending colon                           K92.1, Melena (includes Hematochezia)                           R19.5, Other fecal abnormalities CPT copyright 2016 American Medical Association. All rights reserved. The codes documented in this report are preliminary and upon coder review may  be revised to meet current compliance requirements. Cristopher Estimable. Normalee Sistare, MD Norvel Richards, MD 06/03/2016 4:14:12 PM This report has been signed electronically. Number of Addenda: 0

## 2016-06-03 NOTE — Progress Notes (Signed)
Please excuse [name] from work on 06/03/2016 and 06/04/2106.  He cannot drive, operate machinery, or sign legal documents for 24 hours.  He may return to work on 06/05/2016.

## 2016-06-04 ENCOUNTER — Ambulatory Visit: Payer: Self-pay | Admitting: General Surgery

## 2016-06-04 NOTE — H&P (View-Only) (Signed)
Primary Care Physician:  Lonzo Cloud, MD Primary Gastroenterologist:  Dr. Gala Romney  Pre-Procedure History & Physical: HPI:  [name] is a 55 y.o. male here for further evaluation of change in bowel habits and intermittent low-volume hematochezia. 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 Last colonoscopy was in 2013 where he was found to have a small adenoma - removed. Minimal hemorrhoids. He states over the past few months, he has had a little more difficulty achieving a bowel movement and has noted scant amount of rectal bleeding from time to time. He saw Dr. Zella Richer in [place] recently for an abdominal hernia. Found to have an incisional hernia and surgery has been recommended. However, prior to pursuing a surgical repair, he was referred to evaluate his bowel complaints.  He is now followed by Dr. Whitney Muse as well.  He has a history of erosive reflux esophagitis seen on prior EGD without Barrett's epithelium. He was well controlled on pantoprazole 40 mg once daily up until about one year ago when the [place] switched him to omeprazole 20 mg twice daily. Since that time, he has had frequent breakthrough symptoms. No dysphagia, vomiting or hematemesis. After the initial diagnosis, has was tried on Dexilant;  this was effective in controlling his reflux symptoms but caused significant constipation.   Past Medical History  Diagnosis Date  . Adenocarcinoma of sigmoid colon 2008  . Hypokalemia   . Umbilical hernia   . Carpal tunnel syndrome     Past Surgical History  Procedure Laterality Date  . Colon resection    . Appendectomy  1982  . Carpal tunnel release    . Colonoscopy  12/30/2009    Dr. Gala Romney- s/p segmental L colon resection with anastomosis identified at 22cm. no adenomatous change or malignancy identified.   . Flexible sigmoidoscopy    07/08/2007    RMR: Lobulated 1 cm rectal polyp at 10 cm, resected as described above/ Multiple distal  sigmoid diminutive polyps ablated with the tip of hot snare cautery unit/Obstructing apple core neoplasm beginning at 35 cm from the anal verge, biopsied multiple times  . Colonoscopy   01/11/2008    RMR:  Colonic mucosa from the colostomy to the cecum appeared normal.  . Colonoscopy   07/09/2008    RMR: Normal rectum, surgical anastomosis was 22 cm, polypoid tissue at anastomosis that was a recurrent neoplasm, it was removed with hot snare.  The remainder of residual colon appeared normal.  Relative poor prep on the right side made exam more challenging.  . Esophagogastroduodenoscopy  08/11/12    [place]-erosive reflux esophagitis, distal esophageal ring/superimposed stricture s/p 26F [place], hiatal hernia, gastritis/duodenitis secondary to NSAIDs. Biopsy benign.  . Colonoscopy  12/16/2012    Procedure: COLONOSCOPY;  Surgeon: Daneil Dolin, MD;  Location: [facility];  Service: Endoscopy;  Laterality: N/A;  8:30    Prior to Admission medications   Medication Sig Start Date End Date Taking? Authorizing Provider  omeprazole (PRILOSEC) 20 MG capsule Take 20 mg by mouth 2 (two) times daily.   Yes Historical Provider, MD  simvastatin (ZOCOR) 10 MG tablet Take 10 mg by mouth daily.   Yes Historical Provider, MD  pantoprazole (PROTONIX) 40 MG tablet Take 1 tablet (40 mg total) by mouth daily. 05/26/16   Daneil Dolin, MD    Allergies as of 05/26/2016 - Review Complete 05/26/2016  Allergen Reaction Noted  . Codeine Nausea And Vomiting 06/26/2011  Family History  Problem Relation Age of Onset  . Emphysema Father   . Heart attack Father     Social History   Social History  . Marital Status: Married    Spouse Name: N/A  . Number of Children: 1  . Years of Education: N/A   Occupational History  . [employer]    Social History Main Topics  . Smoking status: Former Research scientist (life sciences)  . Smokeless tobacco: Never Used  . Alcohol Use: No  . Drug Use: No  . Sexual Activity: Yes   Other Topics  Concern  . Not on file   Social History Narrative   Lives with wife & daughter          Review of Systems: See HPI, otherwise negative ROS  Physical Exam: BP 140/64 mmHg  Pulse 58  Temp(Src) 98.3 F (36.8 C) (Oral)  Ht 5\' 2"  (1.575 m)  Wt 209 lb (94.802 kg)  BMI 38.22 kg/m2 General:   Alert,  Well-developed, well-nourished, pleasant and cooperative in NAD Skin:  Intact without significant lesions or rashes. Eyes:  Sclera clear, no icterus.   Conjunctiva pink. Neck:  Supple; no masses or thyromegaly. No significant cervical adenopathy. Lungs:  Clear throughout to auscultation.   No wheezes, crackles, or rhonchi. No acute distress. Heart:  Regular rate and rhythm; no murmurs, clicks, rubs,  or gallops. Abdomen:  Nondistended. Vertical midline surgical scar.  Incisional hernia appreciated superior to the umbilicus - easily reducible.  Abdomen is soft and non-tender without without appreciable mass or organomegaly..  Pulses:  Normal pulses noted. Extremities:  Without clubbing or edema.  Impression:  Pleasant 55 year old gentleman with a history stage III colon cancer -  status post sigmoid colectomy in 2008.  He has done very well until recently.  He's noted some nonspecific symptoms including a tendency towards constipation and scant rectal bleeding. Colonoscopy in 2013 as outlined above. Plans now being made for repair for symptomatic incisional hernia.  I agree with Dr. Zella Richer, his bowel complaints deserve further investigation prior to hernia repair.  As a separate issue, he has a history of erosive reflux esophagitis. Symptoms have  been well controlled historically on pantoprazole 40 mg daily. However, the [facility] change to his regimen to omeprazole 20 mg twice daily about a year ago. Since that time, he has experience frequent breakthrough symptoms without any alarm features.   Recommendations:  I have offered the patient a diagnostic colonoscopy in the near future.  The  risks, benefits, limitations, alternatives and imponderables have been reviewed with the patient. Questions have been answered. All parties are agreeable.  Will start him back on Protonix 40 mg daily in lieu of omeprazole. If his reflux symptoms don't settle down on this regimen, he would need further evaluation.  Further recommendations to follow after colonoscopy has been performed. The risks, benefits, limitations, alternatives and imponderables have been reviewed with the patient. Questions have been answered. All parties are agreeable.   Split prep   Protonix 40 mg daily for reflux  GERD information provided    Notice: This dictation was prepared with Dragon dictation along with smaller phrase technology. Any transcriptional errors that result from this process are unintentional and may not be corrected upon review.

## 2016-06-04 NOTE — H&P (Signed)
[name]  Patient #: [id number] DOB: 10/28/1961 Married / Language: English / Race: White Male   History of Present Illness    The patient is a 55 year old male.  Note:He is referred by Dr. Whitney Muse for consultation regarding a periumbilical hernia. He underwent a sigmoid colectomy and colostomy for obstructing stage III sigmoid colon cancer back in 2008. He subsequently underwent colostomy reversal. He's been having intermittent episodes of periumbilical bulging with nausea vomiting and diarrhea. Initially he was having an episode every 2 months. But now is having 1-2 per month. Recent CT scan demonstrated a periumbilical incisional hernia containing part of his small intestine that was not dilated. He also reports a change in his bowel habits the past 2-3 months with decreasing frequency and intermittent rectal bleeding when he strains. Last colonoscopy was in 2014 by Dr. Gala Romney. Energy level has been good. He does eat a lot of red meat and some fried food.   He had a colonoscopy by Dr. Gala Romney recently which demonstrated a benign polyp and is was felt that the intermittent bleeding was due to hemorrhoids.  RUQ ultrasound did not demonstrate gallstones.    His wife is here with him.  Other Problems  Colon Cancer Gastroesophageal Reflux Disease Hypercholesterolemia Umbilical Hernia Repair  Past Surgical History  Appendectomy Colon Polyp Removal - Colonoscopy Colon Removal - Partial Oral Surgery  Diagnostic Studies History Colonoscopy This month  Allergies  Codeine Sulfate *ANALGESICS - OPIOID*  Medication History  Omeprazole (20MG  Capsule DR, Oral) Active. Simvastatin (20MG  Tablet, Oral) Active. Medications Reconciled  Social History  Alcohol use Remotely quit alcohol use. Caffeine use Carbonated beverages. No drug use Tobacco use Former smoker.  Family History  Alcohol Abuse Father. Heart Disease Father, Mother. Hypertension  Mother.    Review of Systems  General Not Present- Appetite Loss, Chills, Fatigue, Fever, Night Sweats, Weight Gain and Weight Loss. Skin Not Present- Change in Wart/Mole, Dryness, Hives, Jaundice, New Lesions, Non-Healing Wounds, Rash and Ulcer. HEENT Present- Hearing Loss, Ringing in the Ears and Wears glasses/contact lenses. Not Present- Earache, Hoarseness, Nose Bleed, Oral Ulcers, Seasonal Allergies, Sinus Pain, Sore Throat, Visual Disturbances and Yellow Eyes. Respiratory Not Present- Bloody sputum, Chronic Cough, Difficulty Breathing, Snoring and Wheezing. Breast Not Present- Breast Mass, Breast Pain, Nipple Discharge and Skin Changes. Cardiovascular Not Present- Chest Pain, Difficulty Breathing Lying Down, Leg Cramps, Palpitations, Rapid Heart Rate, Shortness of Breath and Swelling of Extremities. Gastrointestinal Present- Abdominal Pain, Constipation and Vomiting. Not Present- Bloating, Bloody Stool, Change in Bowel Habits, Chronic diarrhea, Difficulty Swallowing, Excessive gas, Gets full quickly at meals, Hemorrhoids, Indigestion, Nausea and Rectal Pain. Male Genitourinary Not Present- Blood in Urine, Change in Urinary Stream, Frequency, Impotence, Nocturia, Painful Urination, Urgency and Urine Leakage. Musculoskeletal Not Present- Back Pain, Joint Pain, Joint Stiffness, Muscle Pain, Muscle Weakness and Swelling of Extremities. Neurological Not Present- Decreased Memory, Fainting, Headaches, Numbness, Seizures, Tingling, Tremor, Trouble walking and Weakness. Psychiatric Not Present- Anxiety, Bipolar, Change in Sleep Pattern, Depression, Fearful and Frequent crying. Endocrine Not Present- Cold Intolerance, Excessive Hunger, Hair Changes, Heat Intolerance, Hot flashes and New Diabetes. Hematology Not Present- Easy Bruising, Excessive bleeding, Gland problems, HIV and Persistent Infections.  Physical Exam  The physical exam findings are as follows: Note:General: Overweight male in NAD.  Pleasant and cooperative.  HEENT: [place]/AT, no facial masses  EYES: no icterus  CV: RRR, no murmur, no JVD.  CHEST: Breath sounds equal and clear. Respirations nonlabored.  ABDOMEN: Soft, mild tenderness around the  periumbilical bulge that is reducible. Long midline scar. Right lower quadrant scar. Left mid abdominal scar. No hepatomegaly or palpable masses.  MUSCULOSKELETAL: FROM, good muscle tone, no edema, no venous stasis changes  SKIN: No jaundice.  NEUROLOGIC: Alert and oriented, answers questions appropriately, normal gait and station.  PSYCHIATRIC: Normal mood, affect , and behavior.    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    Plan: I recommend a laparoscopic possible open hernia repair with mesh.   I have discussed the procedure, risks, and aftercare. Risks include but are not limited to bleeding, infection, wound healing problems, anesthesia, recurrence, accidental injury to intra-abdominal organs-such as intestine, liver, spleen, bladder, etc. We also discussed the rare complication of mesh rejection. All questions were answered.  Jackolyn Confer, MD

## 2016-06-04 NOTE — Interval H&P Note (Signed)
History and Physical Interval Note:  06/04/2016 7:42 AM  [name]  has presented today for surgery, with the diagnosis of change in bowel habits, rectal bleeding, history of colon cancer  The various methods of treatment have been discussed with the patient and family. After consideration of risks, benefits and other options for treatment, the patient has consented to  Procedure(s) with comments: COLONOSCOPY (N/A) - 1315 - moved to 6/21 @ 2:00 as a surgical intervention .  The patient's history has been reviewed, patient examined, no change in status, stable for surgery.  I have reviewed the patient's chart and labs.  Questions were answered to the patient's satisfaction.     [name]  No change. Colonoscopy per plan. The risks, benefits, limitations, alternatives and imponderables have been reviewed with the patient. Questions have been answered. All parties are agreeable.

## 2016-06-05 ENCOUNTER — Ambulatory Visit: Payer: Federal, State, Local not specified - PPO | Admitting: Gastroenterology

## 2016-06-10 ENCOUNTER — Encounter (HOSPITAL_COMMUNITY): Payer: Self-pay | Admitting: Internal Medicine

## 2016-06-11 ENCOUNTER — Encounter: Payer: Self-pay | Admitting: Internal Medicine

## 2016-07-27 ENCOUNTER — Encounter: Payer: Self-pay | Admitting: Gastroenterology

## 2016-07-27 ENCOUNTER — Ambulatory Visit (INDEPENDENT_AMBULATORY_CARE_PROVIDER_SITE_OTHER): Payer: Federal, State, Local not specified - PPO | Admitting: Gastroenterology

## 2016-07-27 VITALS — BP 127/78 | HR 62 | Temp 97.2°F | Ht 70.0 in | Wt 208.2 lb

## 2016-07-27 DIAGNOSIS — K5900 Constipation, unspecified: Secondary | ICD-10-CM | POA: Diagnosis not present

## 2016-07-27 DIAGNOSIS — K208 Other esophagitis: Secondary | ICD-10-CM

## 2016-07-27 DIAGNOSIS — Z86010 Personal history of colonic polyps: Secondary | ICD-10-CM | POA: Diagnosis not present

## 2016-07-27 DIAGNOSIS — K2210 Ulcer of esophagus without bleeding: Secondary | ICD-10-CM

## 2016-07-27 NOTE — Assessment & Plan Note (Signed)
Continue MiraLAX 1-2 times daily as needed. Titrate dose down if possible, would improve flatulence.

## 2016-07-27 NOTE — Assessment & Plan Note (Signed)
Doing well on current regimen provided through the [place]. Call with recurrent symptoms. Discussed possibility of adding multivitamin given chronic PPI therapy.

## 2016-07-27 NOTE — Progress Notes (Signed)
Primary Care Physician: Lonzo Cloud, MD  Primary Gastroenterologist:  Garfield Cornea, MD   Chief Complaint  Patient presents with  . Follow-up    doing ok    HPI: [name] is a 55 y.o. male here For follow-up of colonoscopy. He also had recent history of constipation, bowel habit change. Patient has a history of stage III colon cancer, status post sigmoid colectomy in 2008. Recent colonoscopy he June 2017 showed prior surgical anastomosis at 25 cm from the anal verge. Appeared normal. 4 mm tubular adenoma removed from the ascending colon, grade 1 hemorrhoids. Next colonoscopy June 2022.  Bowels are doing better on MiraLAX. Continues to take 2 doses daily. 2-3 soft BMs daily. Complains of gas associated to ask. Notes pantoprazole 40 mg daily did not help his reflux. He is now on omeprazole 40 mg in the morning and 20 mg in the evening per the [place]. Symptoms are well controlled. No dysphagia. Previously did not tolerate Dexilant due to constipation.  Scheduled for umbilical hernia repair in the near future.  Current Outpatient Prescriptions  Medication Sig Dispense Refill  . omeprazole (PRILOSEC) 20 MG capsule Take 20 mg by mouth daily. Takes 2 caps in the morning and 1 at night    . polyethylene glycol (MIRALAX / GLYCOLAX) packet Take 17 g by mouth 2 (two) times daily.    . simvastatin (ZOCOR) 10 MG tablet Take 10 mg by mouth daily.     No current facility-administered medications for this visit.     Allergies as of 07/27/2016 - Review Complete 07/27/2016  Allergen Reaction Noted  . Codeine Nausea And Vomiting 06/26/2011    ROS:  General: Negative for anorexia, weight loss, fever, chills, fatigue, weakness. ENT: Negative for hoarseness, difficulty swallowing , nasal congestion. CV: Negative for chest pain, angina, palpitations, dyspnea on exertion, peripheral edema.  Respiratory: Negative for dyspnea at rest, dyspnea on exertion, cough, sputum, wheezing.  GI: See  history of present illness. GU:  Negative for dysuria, hematuria, urinary incontinence, urinary frequency, nocturnal urination.  Endo: Negative for unusual weight change.    Physical Examination:   BP 127/78   Pulse 62   Temp 97.2 F (36.2 C) (Oral)   Ht 5\' 10"  (1.778 m)   Wt 208 lb 3.2 oz (94.4 kg)   BMI 29.87 kg/m   General: Well-nourished, well-developed in no acute distress.  Eyes: No icterus. Mouth: Oropharyngeal mucosa moist and pink , no lesions erythema or exudate. Lungs: Clear to auscultation bilaterally.  Heart: Regular rate and rhythm, no murmurs rubs or gallops.  Abdomen: Bowel sounds are normal, nontender, nondistended, no hepatosplenomegaly or masses, no abdominal bruits, no rebound or guarding.  Small umbilical hernia easily reducible nontender Extremities: No lower extremity edema. No clubbing or deformities. Neuro: Alert and oriented x 4   Skin: Warm and dry, no jaundice.   Psych: Alert and cooperative, normal mood and affect.  Labs:  Lab Results  Component Value Date   CREATININE 0.91 04/14/2016   BUN 22 (H) 04/14/2016   NA 138 04/14/2016   K 3.9 04/14/2016   CL 104 04/14/2016   CO2 27 04/14/2016   Lab Results  Component Value Date   ALT 27 04/14/2016   AST 21 04/14/2016   ALKPHOS 75 04/14/2016   BILITOT 0.5 04/14/2016   Lab Results  Component Value Date   WBC 8.9 04/14/2016   HGB 15.0 04/14/2016   HCT 44.9 04/14/2016   MCV 84.4 04/14/2016  PLT 200 04/14/2016    Imaging Studies: No results found.

## 2016-07-27 NOTE — Progress Notes (Signed)
cc'ed to pcp °

## 2016-07-27 NOTE — Patient Instructions (Signed)
1. Next colonoscopy in five years. 2. Continue Miralax one to two times daily as needed for constipation. 3. If you have further issues with acid reflux let us know.  4. Return to the office as needed.

## 2016-07-27 NOTE — Assessment & Plan Note (Signed)
History of colon cancer 2008, recent tubular adenoma removed. Next colonoscopy planned for June 2022.

## 2016-08-26 ENCOUNTER — Encounter (HOSPITAL_COMMUNITY): Payer: Self-pay

## 2016-08-26 ENCOUNTER — Encounter (HOSPITAL_COMMUNITY)
Admission: RE | Admit: 2016-08-26 | Discharge: 2016-08-26 | Disposition: A | Discharge status: Home / Self Care | Payer: Federal, State, Local not specified - PPO | Source: Ambulatory Visit | Attending: General Surgery | Admitting: General Surgery

## 2016-08-26 DIAGNOSIS — Z01812 Encounter for preprocedural laboratory examination: Secondary | ICD-10-CM | POA: Diagnosis not present

## 2016-08-26 DIAGNOSIS — K432 Incisional hernia without obstruction or gangrene: Secondary | ICD-10-CM | POA: Insufficient documentation

## 2016-08-26 HISTORY — DX: Reserved for inherently not codable concepts without codable children: IMO0001

## 2016-08-26 HISTORY — DX: Gastro-esophageal reflux disease without esophagitis: K21.9

## 2016-08-26 LAB — COMPREHENSIVE METABOLIC PANEL
ALK PHOS: 72 U/L (ref 38–126)
ALT: 31 U/L (ref 17–63)
ANION GAP: 5 (ref 5–15)
AST: 28 U/L (ref 15–41)
Albumin: 4.5 g/dL (ref 3.5–5.0)
BILIRUBIN TOTAL: 0.5 mg/dL (ref 0.3–1.2)
BUN: 19 mg/dL (ref 6–20)
CALCIUM: 9.7 mg/dL (ref 8.9–10.3)
CO2: 31 mmol/L (ref 22–32)
CREATININE: 1.09 mg/dL (ref 0.61–1.24)
Chloride: 105 mmol/L (ref 101–111)
Glucose, Bld: 98 mg/dL (ref 65–99)
Potassium: 5.1 mmol/L (ref 3.5–5.1)
SODIUM: 141 mmol/L (ref 135–145)
TOTAL PROTEIN: 7.4 g/dL (ref 6.5–8.1)

## 2016-08-26 LAB — CBC WITH DIFFERENTIAL/PLATELET
Basophils Absolute: 0.1 10*3/uL (ref 0.0–0.1)
Basophils Relative: 1 %
EOS ABS: 0.3 10*3/uL (ref 0.0–0.7)
Eosinophils Relative: 4 %
HCT: 48.7 % (ref 39.0–52.0)
HEMOGLOBIN: 16 g/dL (ref 13.0–17.0)
LYMPHS ABS: 2.7 10*3/uL (ref 0.7–4.0)
LYMPHS PCT: 32 %
MCH: 28.4 pg (ref 26.0–34.0)
MCHC: 32.9 g/dL (ref 30.0–36.0)
MCV: 86.5 fL (ref 78.0–100.0)
MONOS PCT: 9 %
Monocytes Absolute: 0.7 10*3/uL (ref 0.1–1.0)
NEUTROS PCT: 54 %
Neutro Abs: 4.6 10*3/uL (ref 1.7–7.7)
Platelets: 229 10*3/uL (ref 150–400)
RBC: 5.63 MIL/uL (ref 4.22–5.81)
RDW: 14.1 % (ref 11.5–15.5)
WBC: 8.4 10*3/uL (ref 4.0–10.5)

## 2016-08-26 NOTE — Patient Instructions (Signed)
[name]  08/26/2016   Your procedure is scheduled on: 09/10/16  Report to [facility]  Entrance take [facility]  elevators to 3rd floor to  [place] at 5:30 AM.  Call this number if you have problems the morning of surgery [phone]   Remember: ONLY 1 PERSON MAY GO WITH YOU TO SHORT STAY TO GET  READY MORNING OF [name].  Do not eat food or drink liquids :After Midnight.     Take these medicines the morning of surgery with A SIP OF WATER: Omeprazole                                You may not have any metal on your body and              piercings  Do not wear jewelry, lotions, powders                        Do not bring valuables to the hospital. [place].  Contacts, dentures or bridgework may not be worn into surgery.  Leave suitcase in the car. After surgery it may be brought to your room.     Patients discharged the day of surgery will not be allowed to drive home.  Name and phone number of your driver: Michael [phone]                 _____________________________________________________________________             [facility] - Preparing for Surgery Before surgery, you can play an important role.  Because skin is not sterile, your skin needs to be as free of germs as possible.  You can reduce the number of germs on your skin by washing with CHG (chlorahexidine gluconate) soap before surgery.  CHG is an antiseptic cleaner which kills germs and bonds with the skin to continue killing germs even after washing. Please DO NOT use if you have an allergy to CHG or antibacterial soaps.  If your skin becomes reddened/irritated stop using the CHG and inform your nurse when you arrive at Short Stay. Do not shave (including legs and underarms) for at least 48 hours prior to the first CHG shower.  You may shave your face/neck. Please follow these instructions carefully:  1.   Shower with CHG Soap the night before surgery and the  morning of Surgery.  2.  If you choose to wash your hair, wash your hair first as usual with your  normal  shampoo.  3.  After you shampoo, rinse your hair and body thoroughly to remove the  shampoo.                           4.  Use CHG as you would any other liquid soap.  You can apply chg directly  to the skin and wash                       Gently with a scrungie or clean washcloth.  5.  Apply the CHG Soap to your body ONLY FROM THE NECK DOWN.   Do not use on face/  open                           Wound or open sores. Avoid contact with eyes, ears mouth and genitals (private parts).                       Wash face,  Genitals (private parts) with your normal soap.             6.  Wash thoroughly, paying special attention to the area where your surgery  will be performed.  7.  Thoroughly rinse your body with warm water from the neck down.  8.  DO NOT shower/wash with your normal soap after using and rinsing off  the CHG Soap.                9.  Pat yourself dry with a clean towel.            10.  Wear clean pajamas.            11.  Place clean sheets on your bed the night of your first shower and do not  sleep with pets. Day of Surgery : Do not apply any lotions/deodorants the morning of surgery.  Please wear clean clothes to the hospital/surgery center.  FAILURE TO FOLLOW THESE INSTRUCTIONS MAY RESULT IN THE CANCELLATION OF YOUR SURGERY PATIENT SIGNATURE_________________________________  NURSE SIGNATURE__________________________________  ________________________________________________________________________

## 2016-09-09 NOTE — Anesthesia Preprocedure Evaluation (Signed)
Anesthesia Evaluation  Patient identified by MRN, date of birth, ID band Patient awake    Reviewed: Allergy & Precautions, NPO status , Patient's Chart, lab work & pertinent test results  Airway Mallampati: II  TM Distance: >3 FB Neck ROM: Full    Dental no notable dental hx.    Pulmonary neg pulmonary ROS, shortness of breath, former smoker,    Pulmonary exam normal breath sounds clear to auscultation       Cardiovascular negative cardio ROS Normal cardiovascular exam Rhythm:Regular Rate:Normal     Neuro/Psych negative neurological ROS  negative psych ROS   GI/Hepatic Neg liver ROS, PUD, GERD  ,  Endo/Other  negative endocrine ROS  Renal/GU negative Renal ROS     Musculoskeletal negative musculoskeletal ROS (+)   Abdominal   Peds  Hematology negative hematology ROS (+)   Anesthesia Other Findings   Reproductive/Obstetrics                             Anesthesia Physical Anesthesia Plan  ASA: II  Anesthesia Plan: General   Post-op Pain Management:    Induction: Intravenous  Airway Management Planned: Oral ETT  Additional Equipment:   Intra-op Plan:   Post-operative Plan: Extubation in OR  Informed Consent: I have reviewed the patients History and Physical, chart, labs and discussed the procedure including the risks, benefits and alternatives for the proposed anesthesia with the patient or authorized representative who has indicated his/her understanding and acceptance.   Dental advisory given  Plan Discussed with: CRNA  Anesthesia Plan Comments:        Anesthesia Quick Evaluation

## 2016-09-10 ENCOUNTER — Observation Stay (HOSPITAL_COMMUNITY)
Admission: RE | Admit: 2016-09-10 | Discharge: 2016-09-11 | Disposition: A | Discharge status: Home / Self Care | Payer: Federal, State, Local not specified - PPO | Source: Ambulatory Visit | Attending: General Surgery | Admitting: General Surgery

## 2016-09-10 ENCOUNTER — Encounter (HOSPITAL_COMMUNITY)
Admission: RE | Disposition: A | Discharge status: Home / Self Care | Payer: Self-pay | Source: Ambulatory Visit | Attending: General Surgery

## 2016-09-10 ENCOUNTER — Ambulatory Visit (HOSPITAL_COMMUNITY): Payer: Federal, State, Local not specified - PPO | Admitting: Certified Registered Nurse Anesthetist

## 2016-09-10 ENCOUNTER — Encounter (HOSPITAL_COMMUNITY): Payer: Self-pay

## 2016-09-10 DIAGNOSIS — Z9049 Acquired absence of other specified parts of digestive tract: Secondary | ICD-10-CM | POA: Insufficient documentation

## 2016-09-10 DIAGNOSIS — K432 Incisional hernia without obstruction or gangrene: Secondary | ICD-10-CM | POA: Diagnosis not present

## 2016-09-10 DIAGNOSIS — K219 Gastro-esophageal reflux disease without esophagitis: Secondary | ICD-10-CM | POA: Diagnosis not present

## 2016-09-10 DIAGNOSIS — Z825 Family history of asthma and other chronic lower respiratory diseases: Secondary | ICD-10-CM | POA: Diagnosis not present

## 2016-09-10 DIAGNOSIS — Z87891 Personal history of nicotine dependence: Secondary | ICD-10-CM | POA: Insufficient documentation

## 2016-09-10 DIAGNOSIS — Z8249 Family history of ischemic heart disease and other diseases of the circulatory system: Secondary | ICD-10-CM | POA: Insufficient documentation

## 2016-09-10 DIAGNOSIS — K660 Peritoneal adhesions (postprocedural) (postinfection): Secondary | ICD-10-CM | POA: Insufficient documentation

## 2016-09-10 DIAGNOSIS — R112 Nausea with vomiting, unspecified: Secondary | ICD-10-CM | POA: Insufficient documentation

## 2016-09-10 DIAGNOSIS — Z885 Allergy status to narcotic agent status: Secondary | ICD-10-CM | POA: Insufficient documentation

## 2016-09-10 DIAGNOSIS — Z85038 Personal history of other malignant neoplasm of large intestine: Secondary | ICD-10-CM | POA: Insufficient documentation

## 2016-09-10 DIAGNOSIS — R0600 Dyspnea, unspecified: Secondary | ICD-10-CM | POA: Diagnosis not present

## 2016-09-10 DIAGNOSIS — Z79899 Other long term (current) drug therapy: Secondary | ICD-10-CM | POA: Insufficient documentation

## 2016-09-10 DIAGNOSIS — E876 Hypokalemia: Secondary | ICD-10-CM | POA: Diagnosis not present

## 2016-09-10 DIAGNOSIS — K640 First degree hemorrhoids: Secondary | ICD-10-CM | POA: Diagnosis not present

## 2016-09-10 HISTORY — PX: INSERTION OF MESH: SHX5868

## 2016-09-10 HISTORY — PX: INCISIONAL HERNIA REPAIR: SHX193

## 2016-09-10 SURGERY — REPAIR, HERNIA, INCISIONAL, LAPAROSCOPIC
Anesthesia: General

## 2016-09-10 MED ORDER — LIDOCAINE 2% (20 MG/ML) 5 ML SYRINGE
INTRAMUSCULAR | Status: DC | PRN
  Administered 2016-09-10: 100 mg via INTRAVENOUS

## 2016-09-10 MED ORDER — FENTANYL CITRATE (PF) 100 MCG/2ML IJ SOLN
INTRAMUSCULAR | Status: AC
  Filled 2016-09-10: qty 2

## 2016-09-10 MED ORDER — ONDANSETRON HCL 4 MG/2ML IJ SOLN
4.0000 mg | INTRAMUSCULAR | Status: DC | PRN
  Administered 2016-09-11 (×2): 4 mg via INTRAVENOUS
  Filled 2016-09-10 (×2): qty 2

## 2016-09-10 MED ORDER — CEFAZOLIN SODIUM-DEXTROSE 2-4 GM/100ML-% IV SOLN
2.0000 g | Freq: Three times a day (TID) | INTRAVENOUS | Status: AC
  Administered 2016-09-10: 2 g via INTRAVENOUS
  Filled 2016-09-10: qty 100

## 2016-09-10 MED ORDER — HEPARIN SODIUM (PORCINE) 5000 UNIT/ML IJ SOLN
5000.0000 [IU] | Freq: Three times a day (TID) | INTRAMUSCULAR | Status: DC
  Administered 2016-09-11: 5000 [IU] via SUBCUTANEOUS

## 2016-09-10 MED ORDER — ROCURONIUM BROMIDE 10 MG/ML (PF) SYRINGE
PREFILLED_SYRINGE | INTRAVENOUS | Status: AC
  Filled 2016-09-10: qty 10

## 2016-09-10 MED ORDER — HYDROMORPHONE HCL 1 MG/ML IJ SOLN
INTRAMUSCULAR | Status: AC
  Administered 2016-09-10: 0.5 mg via INTRAVENOUS
  Filled 2016-09-10: qty 1

## 2016-09-10 MED ORDER — FENTANYL CITRATE (PF) 100 MCG/2ML IJ SOLN
INTRAMUSCULAR | Status: AC
  Filled 2016-09-10: qty 4

## 2016-09-10 MED ORDER — LACTATED RINGERS IV SOLN
INTRAVENOUS | Status: DC | PRN
Start: 2016-09-10 — End: 2016-09-10
  Administered 2016-09-10 (×3): via INTRAVENOUS

## 2016-09-10 MED ORDER — PROPOFOL 10 MG/ML IV BOLUS
INTRAVENOUS | Status: DC | PRN
  Administered 2016-09-10: 200 mg via INTRAVENOUS

## 2016-09-10 MED ORDER — HYDROMORPHONE HCL 1 MG/ML IJ SOLN
0.5000 mg | INTRAMUSCULAR | Status: DC | PRN
  Administered 2016-09-10 – 2016-09-11 (×6): 1 mg via INTRAVENOUS
  Filled 2016-09-10 (×6): qty 1

## 2016-09-10 MED ORDER — MIDAZOLAM HCL 2 MG/2ML IJ SOLN
INTRAMUSCULAR | Status: AC
  Filled 2016-09-10: qty 2

## 2016-09-10 MED ORDER — PROPOFOL 10 MG/ML IV BOLUS
INTRAVENOUS | Status: AC
  Filled 2016-09-10: qty 20

## 2016-09-10 MED ORDER — CEFAZOLIN SODIUM-DEXTROSE 2-4 GM/100ML-% IV SOLN
INTRAVENOUS | Status: AC
  Filled 2016-09-10: qty 100

## 2016-09-10 MED ORDER — ACETAMINOPHEN 10 MG/ML IV SOLN
1000.0000 mg | Freq: Once | INTRAVENOUS | Status: AC
  Administered 2016-09-10: 1000 mg via INTRAVENOUS

## 2016-09-10 MED ORDER — SUCCINYLCHOLINE CHLORIDE 200 MG/10ML IV SOSY
PREFILLED_SYRINGE | INTRAVENOUS | Status: DC | PRN
  Administered 2016-09-10: 100 mg via INTRAVENOUS

## 2016-09-10 MED ORDER — LIDOCAINE 2% (20 MG/ML) 5 ML SYRINGE
INTRAMUSCULAR | Status: AC
  Filled 2016-09-10: qty 5

## 2016-09-10 MED ORDER — CEFAZOLIN SODIUM-DEXTROSE 2-3 GM-% IV SOLR
INTRAVENOUS | Status: DC | PRN
  Administered 2016-09-10: 2 g via INTRAVENOUS

## 2016-09-10 MED ORDER — MIDAZOLAM HCL 5 MG/5ML IJ SOLN
INTRAMUSCULAR | Status: DC | PRN
  Administered 2016-09-10: 2 mg via INTRAVENOUS

## 2016-09-10 MED ORDER — LACTATED RINGERS IV SOLN: INTRAVENOUS | Status: DC

## 2016-09-10 MED ORDER — ACETAMINOPHEN 10 MG/ML IV SOLN
INTRAVENOUS | Status: AC
  Filled 2016-09-10: qty 100

## 2016-09-10 MED ORDER — PROMETHAZINE HCL 25 MG/ML IJ SOLN
6.2500 mg | INTRAMUSCULAR | Status: DC | PRN
  Administered 2016-09-10: 6.25 mg via INTRAVENOUS

## 2016-09-10 MED ORDER — GLYCOPYRROLATE 0.2 MG/ML IV SOSY
PREFILLED_SYRINGE | INTRAVENOUS | Status: AC
  Filled 2016-09-10: qty 3

## 2016-09-10 MED ORDER — FENTANYL CITRATE (PF) 100 MCG/2ML IJ SOLN
INTRAMUSCULAR | Status: DC | PRN
  Administered 2016-09-10 (×2): 50 ug via INTRAVENOUS
  Administered 2016-09-10: 100 ug via INTRAVENOUS
  Administered 2016-09-10 (×2): 50 ug via INTRAVENOUS

## 2016-09-10 MED ORDER — ONDANSETRON HCL 4 MG/2ML IJ SOLN
INTRAMUSCULAR | Status: DC | PRN
  Administered 2016-09-10: 4 mg via INTRAVENOUS

## 2016-09-10 MED ORDER — METHOCARBAMOL 500 MG PO TABS
500.0000 mg | ORAL_TABLET | Freq: Four times a day (QID) | ORAL | Status: DC
  Administered 2016-09-10 – 2016-09-11 (×4): 500 mg via ORAL
  Filled 2016-09-10 (×4): qty 1

## 2016-09-10 MED ORDER — BUPIVACAINE HCL (PF) 0.5 % IJ SOLN
INTRAMUSCULAR | Status: AC
  Filled 2016-09-10: qty 30

## 2016-09-10 MED ORDER — OXYCODONE HCL 5 MG PO TABS
5.0000 mg | ORAL_TABLET | ORAL | Status: DC | PRN
  Administered 2016-09-11: 5 mg via ORAL
  Filled 2016-09-10: qty 1

## 2016-09-10 MED ORDER — KCL IN DEXTROSE-NACL 20-5-0.9 MEQ/L-%-% IV SOLN
INTRAVENOUS | Status: DC
  Administered 2016-09-10 (×2): 1000 mL via INTRAVENOUS
  Filled 2016-09-10 (×2): qty 1000

## 2016-09-10 MED ORDER — EPHEDRINE SULFATE-NACL 50-0.9 MG/10ML-% IV SOSY
PREFILLED_SYRINGE | INTRAVENOUS | Status: DC | PRN
  Administered 2016-09-10: 5 mg via INTRAVENOUS

## 2016-09-10 MED ORDER — MEPERIDINE HCL 50 MG/ML IJ SOLN: 6.2500 mg | INTRAMUSCULAR | Status: DC | PRN

## 2016-09-10 MED ORDER — GLYCOPYRROLATE 0.2 MG/ML IV SOSY
PREFILLED_SYRINGE | INTRAVENOUS | Status: DC | PRN
  Administered 2016-09-10: .2 mg via INTRAVENOUS

## 2016-09-10 MED ORDER — KCL IN DEXTROSE-NACL 20-5-0.9 MEQ/L-%-% IV SOLN
INTRAVENOUS | Status: AC
  Administered 2016-09-10: 1000 mL via INTRAVENOUS
  Filled 2016-09-10: qty 1000

## 2016-09-10 MED ORDER — BUPIVACAINE HCL (PF) 0.5 % IJ SOLN
INTRAMUSCULAR | Status: DC | PRN
  Administered 2016-09-10: 21 mL

## 2016-09-10 MED ORDER — DEXAMETHASONE SODIUM PHOSPHATE 10 MG/ML IJ SOLN
INTRAMUSCULAR | Status: DC | PRN
  Administered 2016-09-10: 10 mg via INTRAVENOUS

## 2016-09-10 MED ORDER — PROMETHAZINE HCL 25 MG/ML IJ SOLN
INTRAMUSCULAR | Status: AC
  Administered 2016-09-10: 6.25 mg via INTRAVENOUS
  Filled 2016-09-10: qty 1

## 2016-09-10 MED ORDER — METHOCARBAMOL 1000 MG/10ML IJ SOLN
500.0000 mg | Freq: Once | INTRAMUSCULAR | Status: AC
  Administered 2016-09-10: 500 mg via INTRAVENOUS
  Filled 2016-09-10: qty 550

## 2016-09-10 MED ORDER — ROCURONIUM BROMIDE 10 MG/ML (PF) SYRINGE
PREFILLED_SYRINGE | INTRAVENOUS | Status: DC | PRN
  Administered 2016-09-10: 5 mg via INTRAVENOUS
  Administered 2016-09-10 (×2): 10 mg via INTRAVENOUS
  Administered 2016-09-10: 45 mg via INTRAVENOUS

## 2016-09-10 MED ORDER — SUGAMMADEX SODIUM 200 MG/2ML IV SOLN
INTRAVENOUS | Status: DC | PRN
  Administered 2016-09-10: 200 mg via INTRAVENOUS

## 2016-09-10 MED ORDER — LACTATED RINGERS IR SOLN
Status: DC | PRN
Start: 2016-09-10 — End: 2016-09-10
  Administered 2016-09-10: 1

## 2016-09-10 MED ORDER — ONDANSETRON HCL 4 MG/2ML IJ SOLN
INTRAMUSCULAR | Status: AC
  Filled 2016-09-10: qty 2

## 2016-09-10 MED ORDER — 0.9 % SODIUM CHLORIDE (POUR BTL) OPTIME
TOPICAL | Status: DC | PRN
  Administered 2016-09-10: 1000 mL

## 2016-09-10 MED ORDER — MORPHINE SULFATE (PF) 2 MG/ML IV SOLN
2.0000 mg | INTRAVENOUS | Status: DC | PRN
  Administered 2016-09-10 (×2): 2 mg via INTRAVENOUS
  Filled 2016-09-10 (×2): qty 1

## 2016-09-10 MED ORDER — SUGAMMADEX SODIUM 200 MG/2ML IV SOLN
INTRAVENOUS | Status: AC
  Filled 2016-09-10: qty 2

## 2016-09-10 MED ORDER — ONDANSETRON 4 MG PO TBDP: 4.0000 mg | ORAL_TABLET | Freq: Four times a day (QID) | ORAL | Status: DC | PRN

## 2016-09-10 MED ORDER — EPHEDRINE 5 MG/ML INJ
INTRAVENOUS | Status: AC
  Filled 2016-09-10: qty 10

## 2016-09-10 MED ORDER — HYDROMORPHONE HCL 1 MG/ML IJ SOLN
0.2500 mg | INTRAMUSCULAR | Status: DC | PRN
  Administered 2016-09-10 (×4): 0.5 mg via INTRAVENOUS

## 2016-09-10 SURGICAL SUPPLY — 41 items
BANDAGE ADH SHEER 1  50/CT (GAUZE/BANDAGES/DRESSINGS) ×12 IMPLANT
BENZOIN TINCTURE PRP APPL 2/3 (GAUZE/BANDAGES/DRESSINGS) ×3 IMPLANT
BINDER ABDOMINAL 12 ML 46-62 (SOFTGOODS) ×3 IMPLANT
CABLE HIGH FREQUENCY MONO STRZ (ELECTRODE) ×3 IMPLANT
CLOSURE STERI-STRIP 1/4X4 (GAUZE/BANDAGES/DRESSINGS) ×3 IMPLANT
CLOSURE WOUND 1/2 X4 (GAUZE/BANDAGES/DRESSINGS) ×1
COVER SURGICAL LIGHT HANDLE (MISCELLANEOUS) ×3 IMPLANT
DECANTER SPIKE VIAL GLASS SM (MISCELLANEOUS) ×3 IMPLANT
DEVICE TROCAR PUNCTURE CLOSURE (ENDOMECHANICALS) ×3 IMPLANT
DISSECTOR BLUNT TIP ENDO 5MM (MISCELLANEOUS) ×3 IMPLANT
DRAPE INCISE IOBAN 66X45 STRL (DRAPES) ×3 IMPLANT
DRSG TEGADERM 2-3/8X2-3/4 SM (GAUZE/BANDAGES/DRESSINGS) ×3 IMPLANT
ELECT PENCIL ROCKER SW 15FT (MISCELLANEOUS) ×3 IMPLANT
ELECT REM PT RETURN 9FT ADLT (ELECTROSURGICAL) ×3
ELECTRODE REM PT RTRN 9FT ADLT (ELECTROSURGICAL) ×1 IMPLANT
GAUZE SPONGE 2X2 8PLY STRL LF (GAUZE/BANDAGES/DRESSINGS) ×1 IMPLANT
GLOVE ECLIPSE 8.0 STRL XLNG CF (GLOVE) ×3 IMPLANT
GLOVE INDICATOR 8.0 STRL GRN (GLOVE) ×3 IMPLANT
GOWN STRL REUS W/TWL XL LVL3 (GOWN DISPOSABLE) ×9 IMPLANT
IRRIG SUCT STRYKERFLOW 2 WTIP (MISCELLANEOUS) ×3
IRRIGATION SUCT STRKRFLW 2 WTP (MISCELLANEOUS) ×1 IMPLANT
KIT BASIN OR (CUSTOM PROCEDURE TRAY) ×3 IMPLANT
MARKER SKIN DUAL TIP RULER LAB (MISCELLANEOUS) ×3 IMPLANT
MESH PARIETEX 4.7 (Mesh General) ×3 IMPLANT
NEEDLE SPNL 22GX3.5 QUINCKE BK (NEEDLE) ×3 IMPLANT
SCISSORS LAP 5X35 DISP (ENDOMECHANICALS) ×3 IMPLANT
SHEARS HARMONIC ACE PLUS 36CM (ENDOMECHANICALS) IMPLANT
SLEEVE XCEL OPT CAN 5 100 (ENDOMECHANICALS) ×6 IMPLANT
SPONGE GAUZE 2X2 STER 10/PKG (GAUZE/BANDAGES/DRESSINGS) ×2
STRIP CLOSURE SKIN 1/2X4 (GAUZE/BANDAGES/DRESSINGS) ×2 IMPLANT
SUT MNCRL AB 4-0 PS2 18 (SUTURE) ×3 IMPLANT
SUT NOVA NAB DX-16 0-1 5-0 T12 (SUTURE) ×3 IMPLANT
TACKER 5MM HERNIA 3.5CML NAB (ENDOMECHANICALS) ×6 IMPLANT
TOWEL OR 17X26 10 PK STRL BLUE (TOWEL DISPOSABLE) ×3 IMPLANT
TOWEL OR NON WOVEN STRL DISP B (DISPOSABLE) ×3 IMPLANT
TRAY LAPAROSCOPIC (CUSTOM PROCEDURE TRAY) ×3 IMPLANT
TROCAR BLADELESS OPT 5 100 (ENDOMECHANICALS) ×3 IMPLANT
TROCAR XCEL BLUNT TIP 100MML (ENDOMECHANICALS) IMPLANT
TROCAR XCEL NON-BLD 11X100MML (ENDOMECHANICALS) ×3 IMPLANT
TUBING INSUF HEATED (TUBING) ×3 IMPLANT
YANKAUER SUCT BULB TIP 10FT TU (MISCELLANEOUS) ×3 IMPLANT

## 2016-09-10 NOTE — Discharge Instructions (Signed)
[facility] _______[facility]   HERNIA REPAIR: POST OP INSTRUCTIONS  Always review your discharge instruction sheet given to you by the facility where your surgery was performed. IF YOU HAVE DISABILITY OR FAMILY LEAVE FORMS, YOU MUST BRING THEM TO THE OFFICE FOR PROCESSING.   DO NOT GIVE THEM TO YOUR DOCTOR.  1. A  prescription for pain medication may be given to you upon discharge.  Take your pain medication as prescribed, if needed.  If narcotic pain medicine is not needed, then you may take acetaminophen (Tylenol) or ibuprofen (Advil) as needed. 2. Take your usually prescribed medications unless otherwise directed. 3. If you need a refill on your pain medication, please contact your pharmacy.  They will contact our office to request authorization. Prescriptions will not be filled after 5 pm or on week-ends. 4. You should follow a light diet the first 24 hours after arrival home, such as soup and crackers, etc.  Be sure to include lots of fluids daily.  Resume your normal diet the day after surgery. 5. Most patients will experience some swelling and bruising around the umbilicus.  Ice packs and reclining will help.  Swelling and bruising can take several days to resolve.  6. It is common to experience some constipation if taking pain medication after surgery.  Increasing fluid intake and taking a stool softener (such as Colace) will usually help or prevent this problem from occurring.  A mild laxative (Milk of Magnesia or Miralax) should be taken according to package directions if there are no bowel movements after 48 hours. 7. Unless discharge instructions indicate otherwise, you may remove your bandages 72 hours after surgery.  You may shower the day after surgery.  You may have steri-strips (small skin tapes) in place directly over the incision.  These strips should be left on the skin until they fall off.  If your surgeon used skin glue on the incision, you may shower in 24 hours.  The  glue will flake off over the next 2-3 weeks.  Any sutures or staples will be removed at the office during your follow-up visit. 8. ACTIVITIES:  You may resume light daily activities beginning the next day--such as daily self-care, walking, climbing stairs--gradually increasing activities as tolerated. Do not lie flat for the first 2-3 days. You may have sexual intercourse when it is comfortable.  Refrain from any heavy lifting or straining-nothing over 10 pounds for 6 weeks.   a. You may drive when you are no longer taking prescription pain medication, you can comfortably wear a seatbelt, and you can safely maneuver your car and apply brakes. b. RETURN TO WORK:  _Desk type work in 1-2 weeks if pain-free with light activities, full duty in 6 weeks._________________________________________________________ 9. You should see your doctor in the office for a follow-up appointment approximately 2-3 weeks after your surgery.  Make sure that you call for this appointment within a day or two after you arrive home to insure a convenient appointment time. 10. OTHER INSTRUCTIONS:  __________________________________________________________________________________________________________________________________________________________________________________________  WHEN TO CALL YOUR DOCTOR: 1. Fever over 101.0 2. Inability to urinate 3. Nausea and/or vomiting 4. Extreme swelling or bruising 5. Continued bleeding from incision. 6. Increased pain, redness, or drainage from the incision  The clinic staff is available to answer your questions during regular business hours.  Please dont hesitate to call and ask to speak to one of the nurses for clinical concerns.  If you have a medical emergency, go to the nearest emergency room  or call 911.  A surgeon from [facility] is always on call at the hospital   [address] [place], [place], [place]  [address] ?  [address], [place], [place] [phone] ? [phone] ? FAX [phone] Web site: [email or web address]

## 2016-09-10 NOTE — Anesthesia Procedure Notes (Signed)
Procedure Name: Intubation Date/Time: 09/10/2016 7:41 AM Performed by: Montel Clock Pre-anesthesia Checklist: Patient identified, Emergency Drugs available, Suction available, Patient being monitored and Timeout performed Patient Re-evaluated:Patient Re-evaluated prior to inductionOxygen Delivery Method: Circle system utilized Preoxygenation: Pre-oxygenation with 100% oxygen Intubation Type: IV induction and Rapid sequence Laryngoscope Size: Mac and 3 Grade View: Grade II Tube type: Oral Tube size: 7.5 mm Number of attempts: 1 Airway Equipment and Method: Stylet Placement Confirmation: ETT inserted through vocal cords under direct vision,  positive ETCO2 and breath sounds checked- equal and bilateral Secured at: 23 cm Tube secured with: Tape Dental Injury: Teeth and Oropharynx as per pre-operative assessment

## 2016-09-10 NOTE — Op Note (Signed)
Operative Note  ARSHDEEP FRUCHEY male 55 y.o. 09/10/2016  PREOPERATIVE DX:  Ventral incisional hernia  POSTOPERATIVE DX:  Same with intra-abdominal adhesions  PROCEDURE:   1. Laparoscopic lysis of adhesions for 60 minutes. 2 laparoscopic ventral incisional hernia repair with mesh ( Parietex 12 cm x 12 cm composite).         Surgeon: Odis Hollingshead   Assistants: Verita Lamb, M.D.  Anesthesia: General endotracheal anesthesia  Indications:   This is a 55 year old male which had multiple previous abdominal operations. He has a symptomatic periumbilical ventral hernia containing small intestine on CT scan. He now presents for repair.    Procedure Detail:  He was brought to the operating room placed supine on the operating table and a general anesthetic was given. A Foley catheter was inserted. The hair on the abdominal wall was clipped. The abdominal wall was widely sterilely prepped and draped. A timeout was performed.  He is placed in slight reverse Trendelenburg position. A 5 mm incision was made in the right subcostal area. Using a 5 mm Optiview trocar laparoscope, access was gained into the peritoneal cavity. A pneumoperitoneum was then created by insufflation of CO2 gas. Inspection of the area under the trocar demonstrated no evidence of organ injury or bleeding.  Multiple adhesions were noted between the omentum and small intestine and midline abdominal wall. A 5 mm trocar was placed in the right mid abdomen. Using careful blunt and sharp dissection I began separating these adhesions from the abdominal wall.  Another 5 mm trocar was then placed in the right lower quadrant. I continued to lyse lesions. When separating the small bowel from the abdominal wall I left some peritoneum and fascia on the bowel. Some of the previous closure sutures appeared to catch a very thin segment of serosa and these were also left on the bowel is here cut out of the abdominal wall. After 60 minutes of  doing this, I had identified the hernia and freed up the abdominal wall from all adhesions for a distance of 6-7 cm in all directions. I then carefully inspected all quadrants of the abdomen and the small bowel. There was no evidence of bleeding or organ injury.  I then took a spinal needle and marked the edges of the hernia in 4 quadrants. I measured 4 cm from this. A piece of Parietex composite mesh measuring 12 cm by 12 cm was brought onto the field. This mesh would cover the defect and allow for 4-5 cm of overlap. #1 Novafil suture was placed at the 12, 3, 6, 9:00 position. The right upper quadrant 5 mm trocar was changed to an 11 mm trocar. The mesh was then hydrated and placed into the abdominal cavity. The nonadherent side faced the viscera, the rough side faced the abdominal wall. I then made 4 stab incisions to coincide with the anchoring suture positions on the mesh. The suture was then brought up across the fascial bridge and tying down anchoring the rough side of the mesh to the abdominal wall. I further anchored the mesh to the abdominal wall using spiral tacks. This provided for adequate coverage with good overlap of the hernia defect.  I once again inspected the entire abdominal cavity and noted no evidence of bleeding or organ injury. I then released the CO2 gas and watched the mesh approximated to the underlying viscera and omentum. The trocars were then removed.  All incisions were then closed with 4-0 Monocryl subcuticular stitches. Steri-Strips and sterile dressings  were applied.  He tolerated the procedure well without any apparent complications. He was taken to the recovery room in satisfactory condition.   Estimated Blood Loss:  150 ml               Complications:  * No complications entered in OR log *         Disposition: PACU - hemodynamically stable.         Condition: stable

## 2016-09-10 NOTE — Anesthesia Postprocedure Evaluation (Signed)
Anesthesia Post Note  Patient: [name]  Procedure(s) Performed: Procedure(s) (LRB): LAPAROSCOPIC LYSIS OF ADHESIONS; LAPAROSCOPIC REPAIR OF INCISIONAL HERNIA REPAIR WITH MESH (N/A) INSERTION OF MESH (N/A)  Patient location during evaluation: PACU Anesthesia Type: General Level of consciousness: sedated and patient cooperative Pain management: pain level controlled Vital Signs Assessment: post-procedure vital signs reviewed and stable Respiratory status: spontaneous breathing Cardiovascular status: stable Anesthetic complications: no    Last Vitals:  Vitals:   09/10/16 1115 09/10/16 1130  BP: 113/88 108/82  Pulse: 92 85  Resp: 18 10  Temp:  36.6 C    Last Pain:  Vitals:   09/10/16 1130  TempSrc:   PainSc: [place]

## 2016-09-10 NOTE — Transfer of Care (Signed)
Immediate Anesthesia Transfer of Care Note  Patient: [name]  Procedure(s) Performed: Procedure(s): LAPAROSCOPIC LYSIS OF ADHESIONS; LAPAROSCOPIC REPAIR OF INCISIONAL HERNIA REPAIR WITH MESH (N/A) INSERTION OF MESH (N/A)  Patient Location: PACU  Anesthesia Type:General  Level of Consciousness:  sedated, patient cooperative and responds to stimulation  Airway & Oxygen Therapy:Patient Spontanous Breathing and Patient connected to face mask oxgen  Post-op Assessment:  Report given to PACU RN and Post -op Vital signs reviewed and stable  Post vital signs:  Reviewed and stable  Last Vitals:  Vitals:   09/10/16 0533  BP: 135/80  Pulse: 75  Resp: 16  Temp: [id number] C    Complications: No apparent anesthesia complications

## 2016-09-10 NOTE — H&P (Signed)
[name] is an 55 y.o. male.   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 He subsequently underwent colostomy reversal. He's been having intermittent episodes of periumbilical bulging with nausea vomiting and diarrhea. Initially he was having an episode every 2 months. But now is having 1-2 per month. Recent CT scan demonstrated a periumbilical incisional hernia containing part of his small intestine that was not dilated.  He now presents for elective repair.  Past Medical History:  Diagnosis Date  . Adenocarcinoma of sigmoid colon 2008  . Carpal tunnel syndrome   . GERD (gastroesophageal reflux disease)   . Hypokalemia   . Shortness of breath dyspnea    on exertion  . Umbilical hernia     Past Surgical History:  Procedure Laterality Date  . APPENDECTOMY  1982  . CARPAL TUNNEL RELEASE    . colon resection    . COLONOSCOPY  12/30/2009   Dr. Gala Romney- s/p segmental L colon resection with anastomosis identified at 22cm. no adenomatous change or malignancy identified.   . COLONOSCOPY   01/11/2008   RMR:  Colonic mucosa from the colostomy to the cecum appeared normal.  . COLONOSCOPY   07/09/2008   RMR: Normal rectum, surgical anastomosis was 22 cm, polypoid tissue at anastomosis that was a recurrent neoplasm, it was removed with hot snare.  The remainder of residual colon appeared normal.  Relative poor prep on the right side made exam more challenging.  Marland Kitchen COLONOSCOPY  12/16/2012   Procedure: COLONOSCOPY;  Surgeon: Daneil Dolin, MD;  Location: [facility];  Service: Endoscopy;  Laterality: N/A;  8:30  . COLONOSCOPY N/A 06/03/2016   Dr. Gala Romney: Normal surgical anastomosis at 25 cm from the anal verge. Grade 1 hemorrhoids. 4 mm tubular adenoma removed from the descending colon. Next colonoscopy June 2022.  . ESOPHAGOGASTRODUODENOSCOPY  08/11/12   Rourk-erosive reflux  esophagitis, distal esophageal ring/superimposed stricture s/p 54F [place], hiatal hernia, gastritis/duodenitis secondary to NSAIDs. Biopsy benign.  Marland Kitchen FLEXIBLE SIGMOIDOSCOPY    07/08/2007   RMR: Lobulated 1 cm rectal polyp at 10 cm, resected as described above/ Multiple distal sigmoid diminutive polyps ablated with the tip of hot snare cautery unit/Obstructing apple core neoplasm beginning at 35 cm from the anal verge, biopsied multiple times    Family History  Problem Relation Age of Onset  . Emphysema Father   . Heart attack Father    Social History:  reports that he has quit smoking. He has never used smokeless tobacco. He reports that he does not drink alcohol or use drugs.  Allergies:  Allergies  Allergen Reactions  . Codeine Nausea And Vomiting    Pt prefers to have food on stomach if he needs to take med     Medications Prior to Admission  Medication Sig Dispense Refill  . omeprazole (PRILOSEC) 20 MG capsule Take 20 mg by mouth daily. Takes 2 caps in the morning and 1 at night    . polyethylene glycol (MIRALAX / GLYCOLAX) packet Take 17 g by mouth 2 (two) times daily.    . simvastatin (ZOCOR) 10 MG tablet Take 10 mg by mouth daily.      No results found for this or any previous visit (from the past 48 hour(s)). No results found.  Review of Systems  Constitutional: Negative for chills and fever.  Gastrointestinal: Positive for abdominal pain. Negative for diarrhea, nausea and  vomiting.    Blood pressure 135/80, pulse 75, temperature 97.7 F (36.5 C), temperature source Oral, resp. rate 16, height 5\' 10"  (1.778 m), weight 94.3 kg (208 lb), SpO2 99 %. Physical Exam  Constitutional: No distress.  Overweight male.  Cardiovascular: Normal rate and regular rhythm.   Respiratory: Effort normal and breath sounds normal.  GI: Soft.  Midline scar present with periumbilical bulge. Left-sided scar present.  Musculoskeletal: He exhibits edema.  Neurological: He is alert.  Skin:  Skin is warm and dry.  Psychiatric: He has a normal mood and affect. His behavior is normal.     Assessment/Plan Symptomatic ventral incisional hernia.  Plan laparoscopic possible open repair with mesh.  Odis Hollingshead, MD 09/10/2016, 7:22 AM

## 2016-09-10 NOTE — Interval H&P Note (Signed)
History and Physical Interval Note:  09/10/2016 7:28 AM  [name]  has presented today for surgery, with the diagnosis of Incisional hernia  The various methods of treatment have been discussed with the patient and family. After consideration of risks, benefits and other options for treatment, the patient has consented to  Procedure(s): LAPAROSCOPIC, POSSIBLE OPEN INCISIONAL HERNIA REPAIR WITH MESH (N/A) INSERTION OF MESH (N/A) as a surgical intervention .  The patient's history has been reviewed, patient examined, no change in status, stable for surgery.  I have reviewed the patient's chart and labs.  Questions were answered to the patient's satisfaction.     Zerek Litsey Lenna Sciara

## 2016-09-11 ENCOUNTER — Encounter (HOSPITAL_COMMUNITY): Payer: Self-pay | Admitting: General Surgery

## 2016-09-11 DIAGNOSIS — K432 Incisional hernia without obstruction or gangrene: Secondary | ICD-10-CM | POA: Diagnosis not present

## 2016-09-11 MED ORDER — ONDANSETRON HCL 4 MG PO TABS: 4.0000 mg | ORAL_TABLET | ORAL | 0 refills | Status: DC | PRN

## 2016-09-11 MED ORDER — OXYCODONE HCL 5 MG PO TABS: 5.0000 mg | ORAL_TABLET | ORAL | 0 refills | Status: DC | PRN

## 2016-09-11 MED ORDER — PROMETHAZINE HCL 25 MG/ML IJ SOLN
12.5000 mg | Freq: Once | INTRAMUSCULAR | Status: AC
  Administered 2016-09-11: 12.5 mg via INTRAVENOUS
  Filled 2016-09-11: qty 1

## 2016-09-11 NOTE — Progress Notes (Signed)
Discharge instructions discussed with patient and family, verbalized agreement and understanding.  Prescriptions given to patient 

## 2016-09-11 NOTE — Progress Notes (Signed)
1 Day Post-Op  Subjective: Trouble with pain control initially, better now. Had one episode of n/v very early this AM.  Ate a solid breakfast and so far has not had any problems.  Voiding. Walking.  Objective: Vital signs in last 24 hours: Temp:  [97.5 F (36.4 C)-98.8 F (37.1 C)] 97.5 F (36.4 C) (09/29 [id number]) Pulse Rate:  [54-100] 77 (09/29 0613) Resp:  [9-21] 18 (09/29 0613) BP: (102-129)/(54-97) 120/63 (09/29 0613) SpO2:  [94 %-100 %] 96 % (09/29 0613) Last BM Date: 09/09/16  Intake/Output from previous day: 09/28 0701 - 09/29 0700 In: 5768.3 [P.O.:1980; I.V.:3788.3] Out: 2705 [Urine:2700; Blood:5] Intake/Output this shift: No intake/output data recorded.  PE: General- In NAD Abdomen-soft, dressings dry, active bowel sounds  Lab Results:  No results for input(s): WBC, HGB, HCT, PLT in the last 72 hours. BMET No results for input(s): NA, K, CL, CO2, GLUCOSE, BUN, CREATININE, CALCIUM in the last 72 hours. PT/INR No results for input(s): LABPROT, INR in the last 72 hours. Comprehensive Metabolic Panel:    Component Value Date/Time   NA 141 08/26/2016 1010   NA 138 04/14/2016 0927   K 5.1 08/26/2016 1010   K 3.9 04/14/2016 0927   CL 105 08/26/2016 1010   CL 104 04/14/2016 0927   CO2 31 08/26/2016 1010   CO2 27 04/14/2016 0927   BUN 19 08/26/2016 1010   BUN 22 (H) 04/14/2016 0927   CREATININE 1.09 08/26/2016 1010   CREATININE 0.91 04/14/2016 0927   GLUCOSE 98 08/26/2016 1010   GLUCOSE 108 (H) 04/14/2016 0927   CALCIUM 9.7 08/26/2016 1010   CALCIUM 8.7 (L) 04/14/2016 0927   AST 28 08/26/2016 1010   AST 21 04/14/2016 0927   ALT 31 08/26/2016 1010   ALT 27 04/14/2016 0927   ALKPHOS 72 08/26/2016 1010   ALKPHOS 75 04/14/2016 0927   BILITOT 0.5 08/26/2016 1010   BILITOT 0.5 04/14/2016 0927   PROT 7.4 08/26/2016 1010   PROT 6.9 04/14/2016 0927   ALBUMIN 4.5 08/26/2016 1010   ALBUMIN 4.2 04/14/2016 0927     Studies/Results: No results  found.  Anti-infectives: Anti-infectives    Start     Dose/Rate Route Frequency Ordered Stop   09/10/16 1600  ceFAZolin (ANCEF) IVPB 2g/100 mL premix     2 g 200 mL/hr over 30 Minutes Intravenous Every 8 hours 09/10/16 1121 09/10/16 1639      Assessment Active Problems:   Incisional hernia s/p laparoscopic repair with mesh 09/10/16-looks better this AM.    LOS: 0 days   Plan: If he does well this morning, will discharge today.  Discharge instructions discussed with him.   Jamilett Ferrante Lenna Sciara 09/11/2016

## 2016-09-11 NOTE — Progress Notes (Signed)
Patient was walking in the hallway  suddenly feels nauseous ,and threw up in the trash can. Assisted the patient back to his room via wheelchair, given pain and nausea medicine. We will continue to monitor.

## 2016-10-01 NOTE — Discharge Summary (Signed)
Physician Discharge Summary  Patient ID: [name] MRN: [id number] DOB/AGE: [birth year] 56 y.o.  Admit date: 09/10/2016 Discharge date: 09/11/2016  Admission Diagnoses:  Incisional hernia  Discharge Diagnoses:  Active Problems:   Incisional hernia status post laparoscopic repair with mesh 09/10/2016   Discharged Condition: good  Hospital Course: He underwent the above procedure and tolerated it well. He was doing well postoperative day #1 and was able to discharge. Discharge instructions were given to him.    Discharge Exam: Blood pressure 120/63, pulse 77, temperature 97.5 F (36.4 C), temperature source Oral, resp. rate 18, height 5\' 10"  (1.778 m), weight 94.3 kg (208 lb), SpO2 96 %.   Disposition: 01-Home or Self Care     Medication List    TAKE these medications   omeprazole 20 MG capsule Commonly known as:  PRILOSEC Take 20 mg by mouth daily. Takes 2 caps in the morning and 1 at night   ondansetron 4 MG tablet Commonly known as:  ZOFRAN Take 1 tablet (4 mg total) by mouth every 4 (four) hours as needed for nausea.   ondansetron 4 MG tablet Commonly known as:  ZOFRAN Take 1 tablet (4 mg total) by mouth every 4 (four) hours as needed for nausea or vomiting.   oxyCODONE 5 MG immediate release tablet Commonly known as:  Oxy IR/ROXICODONE Take 1-2 tablets (5-10 mg total) by mouth every 4 (four) hours as needed for moderate pain.   polyethylene glycol packet Commonly known as:  MIRALAX / GLYCOLAX Take 17 g by mouth 2 (two) times daily.   simvastatin 10 MG tablet Commonly known as:  ZOCOR Take 10 mg by mouth daily.        Signed: Odis Hollingshead 10/01/2016, 9:45 AM

## 2017-01-27 ENCOUNTER — Telehealth: Payer: Self-pay | Admitting: Gastroenterology

## 2017-01-27 NOTE — Telephone Encounter (Signed)
ON RECALL  °

## 2017-01-27 NOTE — Telephone Encounter (Signed)
Patient needs to be on recall for ruq u/s for gallbladder polyp 05/2017.

## 2017-04-15 ENCOUNTER — Other Ambulatory Visit (HOSPITAL_COMMUNITY): Payer: Federal, State, Local not specified - PPO

## 2017-04-15 ENCOUNTER — Ambulatory Visit (HOSPITAL_COMMUNITY): Payer: Federal, State, Local not specified - PPO

## 2017-04-20 ENCOUNTER — Telehealth: Payer: Self-pay | Admitting: Internal Medicine

## 2017-04-20 NOTE — Telephone Encounter (Signed)
Letter mailed

## 2017-04-20 NOTE — Telephone Encounter (Signed)
RECALL FOR ULTRASOUND ABD °

## 2017-04-26 IMAGING — CT CT ABD-PELV W/ CM
2 of 5 series · 16 of 46 positions shown, 18 images · IV contrast (iopamidol)
Comparison: CT 01/09/2013

CLINICAL DATA: Periumbilical pain which started [REDACTED]. History of
adenocarcinoma of the sigmoid colon with rectal resection. History
of appendectomy and hernia repair.

EXAM:
CT ABDOMEN AND PELVIS WITH CONTRAST
TECHNIQUE: Multidetector CT imaging of the abdomen and pelvis was performed
using the standard protocol following bolus administration of
intravenous contrast.
CONTRAST:  100mL LNRQI0-HCC IOPAMIDOL (LNRQI0-HCC) INJECTION 61%

[Series 2: abd_pel_with 5.0 b40f · axial · 0.74mm/px · z∈[-479,-69]mm · 13 of 96 slices shown, 15 images]
[im 7/96  soft-tissue]
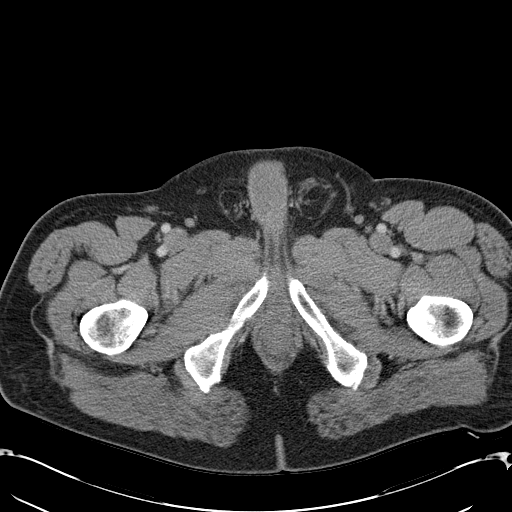
[im 7/96  bone]
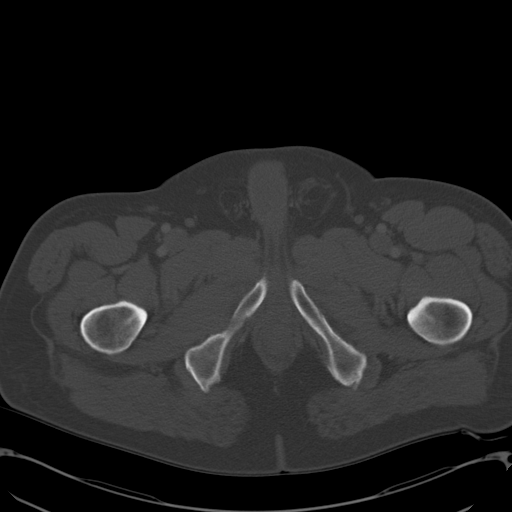
[im 14/96  soft-tissue]
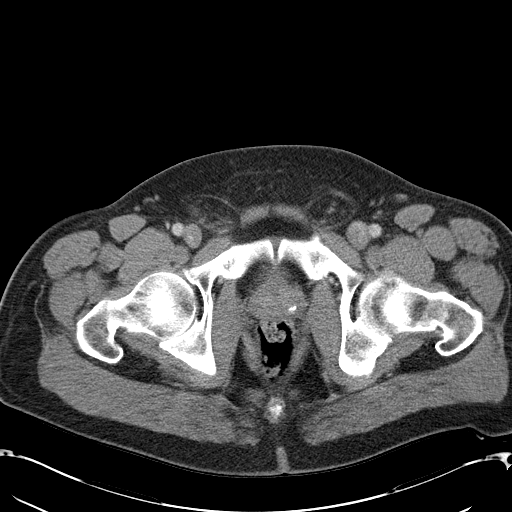
[im 21/96  soft-tissue]
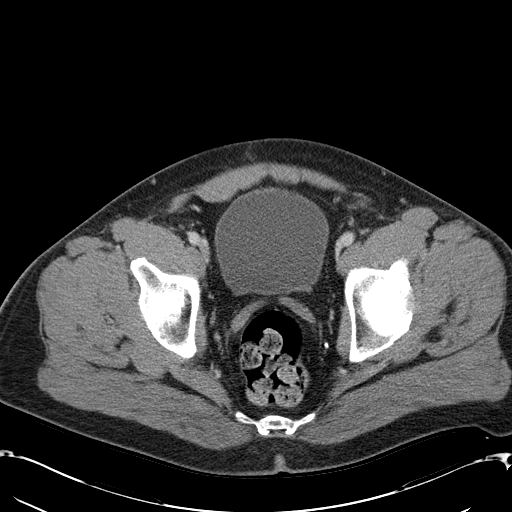
[im 28/96  soft-tissue]
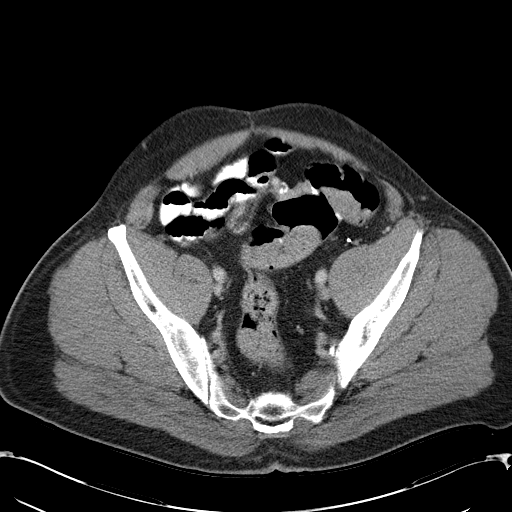
[im 34/96  soft-tissue]
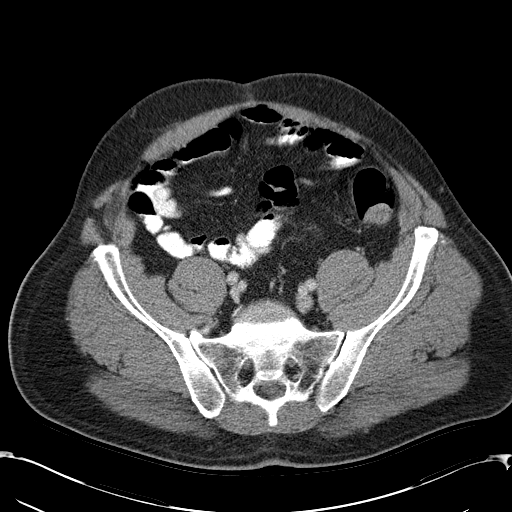
[im 41/96  soft-tissue]
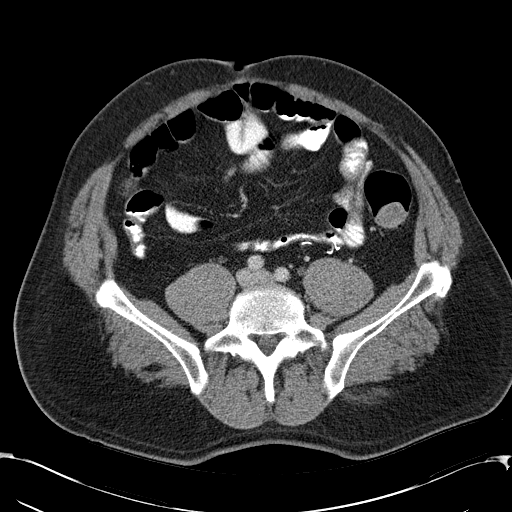
[im 48/96  soft-tissue]
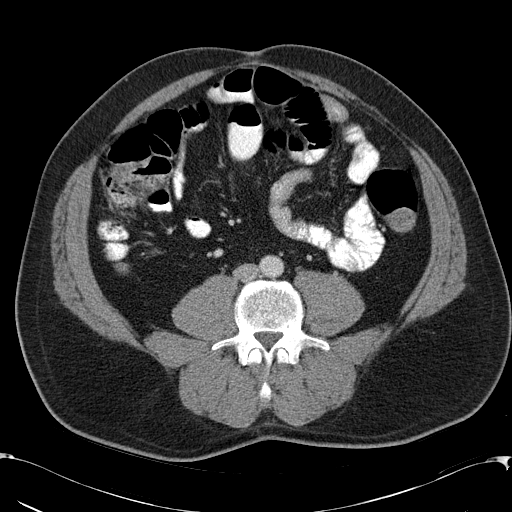
[im 55/96  soft-tissue]
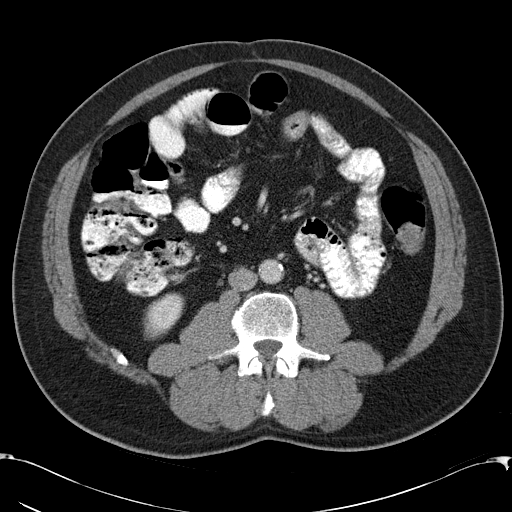
[im 62/96  soft-tissue]
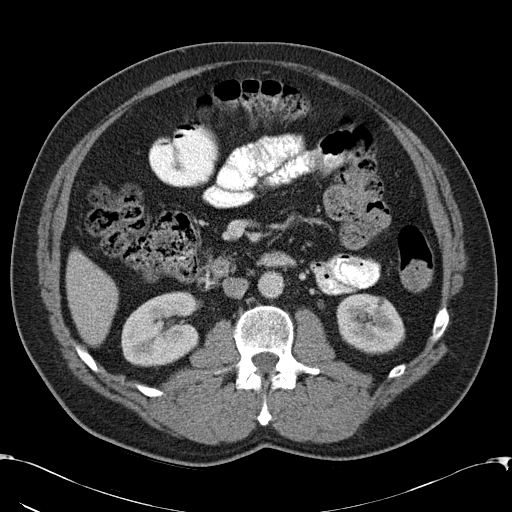
[im 62/96  bone]
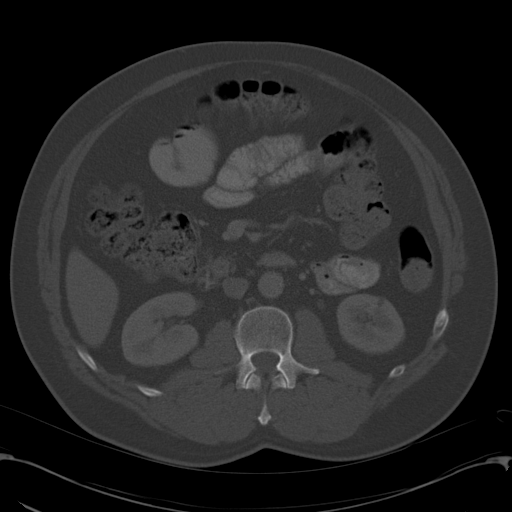
[im 68/96  soft-tissue]
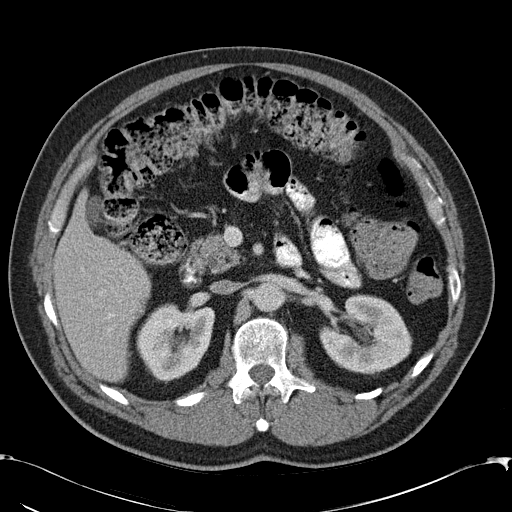
[im 75/96  soft-tissue]
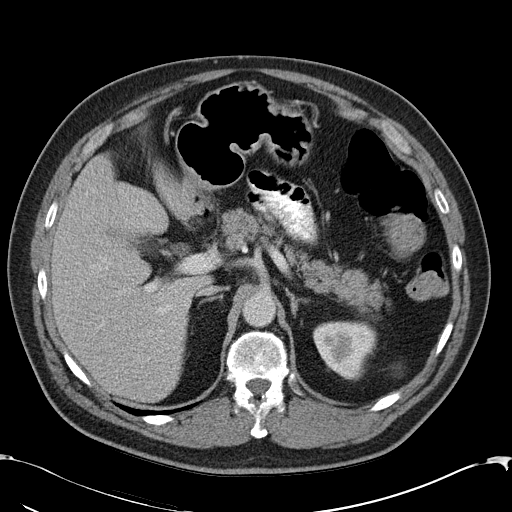
[im 82/96  soft-tissue]
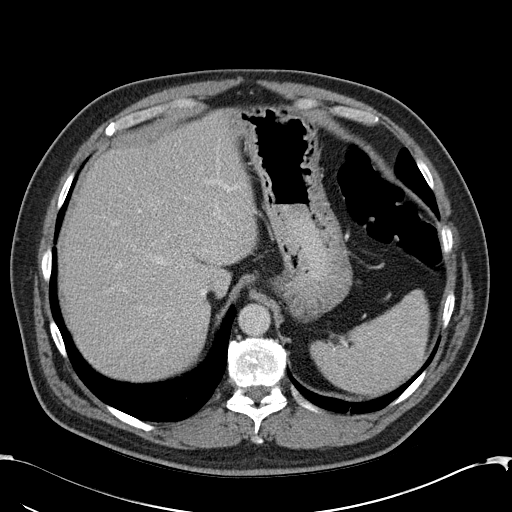
[im 89/96  soft-tissue]
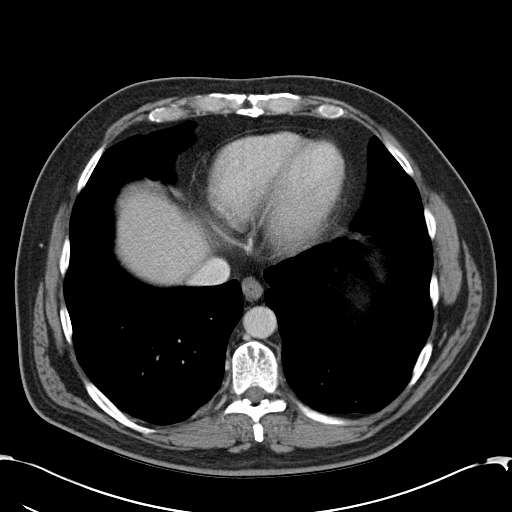

[Series 5: abd_pel_with 3.0 spo cor · coronal · 0.74mm/px · 3 of 104 slices shown]
[im 35/104  soft-tissue]
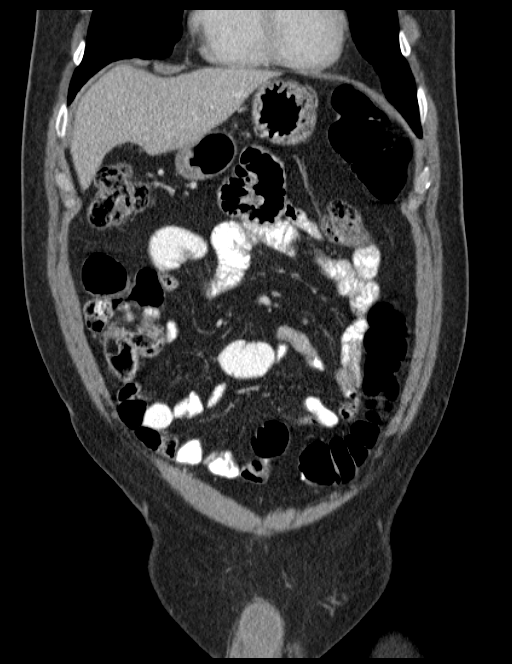
[im 46/104  soft-tissue]
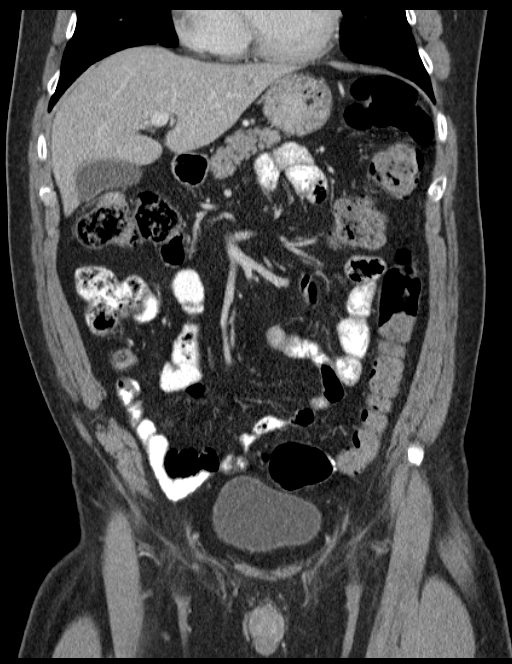
[im 58/104  soft-tissue]
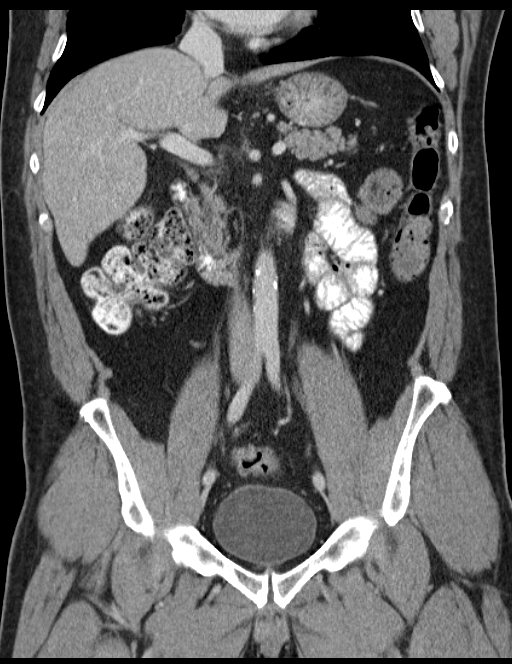

[16 of 46 positions shown; findings below may reference images not displayed]

FINDINGS: Lower chest: Lung bases are clear.

Hepatobiliary: 4 small hypodense lesions in the LEFT hepatic lobe
are unchanged measuring 5 mm each .

Pancreas: No pancreatic lesion.

Spleen: Normal spleen

Adrenals/urinary tract: Adrenal glands and kidneys are normal. The
ureters and bladder normal.

Stomach/Bowel: Stomach small bowel cecum are normal. Moderate volume
stool in the colon. Anastomosis in the proximal sigmoid colon
without obstruction or mass. The distal colon rectum are normal.

A loop of small bowel extends into a umbilical hernia. Single wall
of the small bowel enters the small hernia sac. This sac measures
approximately 3 cm. No change from CT 8841.

Vascular/Lymphatic: Abdominal aorta is normal caliber. There is no
retroperitoneal or periportal lymphadenopathy. No pelvic
lymphadenopathy.

Reproductive: Prostate normal

Other: No free fluid.

Musculoskeletal: No aggressive osseous lesion.
IMPRESSION: 1. Small periumbilical hernia contains a loop of small bowel without
obstruction. Single wall of the bowel enters the hernia sac which
can predispose to obstruction. No significant change in this pattern
compared to CT of 01/09/2013.
[DATE]. Sigmoid colon anastomosis without evidence of local recurrence.
3. Small hypodense lesions liver stable.

## 2017-05-31 ENCOUNTER — Other Ambulatory Visit (HOSPITAL_COMMUNITY): Payer: Self-pay | Admitting: *Deleted

## 2017-05-31 DIAGNOSIS — C187 Malignant neoplasm of sigmoid colon: Secondary | ICD-10-CM

## 2017-06-01 ENCOUNTER — Encounter (HOSPITAL_COMMUNITY): Payer: Federal, State, Local not specified - PPO

## 2017-06-01 ENCOUNTER — Encounter (HOSPITAL_COMMUNITY): Payer: Federal, State, Local not specified - PPO | Attending: Oncology | Admitting: Oncology

## 2017-06-01 ENCOUNTER — Encounter (HOSPITAL_COMMUNITY): Payer: Self-pay

## 2017-06-01 VITALS — BP 121/76 | HR 80 | Temp 97.5°F | Resp 18 | Wt 216.9 lb

## 2017-06-01 DIAGNOSIS — Z85038 Personal history of other malignant neoplasm of large intestine: Secondary | ICD-10-CM | POA: Diagnosis not present

## 2017-06-01 DIAGNOSIS — C187 Malignant neoplasm of sigmoid colon: Secondary | ICD-10-CM

## 2017-06-01 LAB — CBC WITH DIFFERENTIAL/PLATELET
Basophils Absolute: 0.1 10*3/uL (ref 0.0–0.1)
Basophils Relative: 1 %
Eosinophils Absolute: 0.3 10*3/uL (ref 0.0–0.7)
Eosinophils Relative: 4 %
HEMATOCRIT: 46.3 % (ref 39.0–52.0)
HEMOGLOBIN: 15.6 g/dL (ref 13.0–17.0)
LYMPHS ABS: 2.5 10*3/uL (ref 0.7–4.0)
LYMPHS PCT: 35 %
MCH: 28.4 pg (ref 26.0–34.0)
MCHC: 33.7 g/dL (ref 30.0–36.0)
MCV: 84.3 fL (ref 78.0–100.0)
Monocytes Absolute: 0.5 10*3/uL (ref 0.1–1.0)
Monocytes Relative: 6 %
NEUTROS ABS: 4 10*3/uL (ref 1.7–7.7)
NEUTROS PCT: 54 %
Platelets: 189 10*3/uL (ref 150–400)
RBC: 5.49 MIL/uL (ref 4.22–5.81)
RDW: 14.4 % (ref 11.5–15.5)
WBC: 7.3 10*3/uL (ref 4.0–10.5)

## 2017-06-01 LAB — COMPREHENSIVE METABOLIC PANEL
ALK PHOS: 75 U/L (ref 38–126)
ALT: 32 U/L (ref 17–63)
AST: 27 U/L (ref 15–41)
Albumin: 4.3 g/dL (ref 3.5–5.0)
Anion gap: 7 (ref 5–15)
BILIRUBIN TOTAL: 0.4 mg/dL (ref 0.3–1.2)
BUN: 16 mg/dL (ref 6–20)
CO2: 29 mmol/L (ref 22–32)
CREATININE: 1.11 mg/dL (ref 0.61–1.24)
Calcium: 8.8 mg/dL — ABNORMAL LOW (ref 8.9–10.3)
Chloride: 102 mmol/L (ref 101–111)
GFR calc non Af Amer: >60 mL/min (ref >60)
GLUCOSE: 137 mg/dL — AB (ref 65–99)
Potassium: 3.5 mmol/L (ref 3.5–5.1)
SODIUM: 138 mmol/L (ref 135–145)
Total Protein: 6.8 g/dL (ref 6.5–8.1)

## 2017-06-01 NOTE — Patient Instructions (Signed)
[facility] Discharge Instructions  RECOMMENDATIONS MADE BY THE CONSULTANT AND ANY TEST RESULTS WILL BE SENT TO YOUR REFERRING PHYSICIAN.  You saw Dr. Zhou today.  Thank you for choosing [facility] at [facility] to provide your oncology and hematology care.  To afford each patient quality time with our provider, please arrive at least 15 minutes before your scheduled appointment time.    If you have a lab appointment with the Cancer Center please come in thru the  Main Entrance and check in at the main information desk  You need to re-schedule your appointment should you arrive 10 or more minutes late.  We strive to give you quality time with our providers, and arriving late affects you and other patients whose appointments are after yours.  Also, if you no show three or more times for appointments you may be dismissed from the clinic at the providers discretion.     Again, thank you for choosing [facility].  Our hope is that these requests will decrease the amount of time that you wait before being seen by our physicians.       _____________________________________________________________  Should you have questions after your visit to [facility], please contact our office at [phone] between the hours of 8:30 a.m. and 4:30 p.m.  Voicemails left after 4:30 p.m. will not be returned until the following business day.  For prescription refill requests, have your pharmacy contact our office.       Resources For Cancer Patients and their Caregivers ? American Cancer Society: Can assist with transportation, wigs, general needs, runs Look Good Feel Better.        1-888-227-6333 ? Cancer Care: Provides financial assistance, online support groups, medication/co-pay assistance.  1-800-813-HOPE (4673) ? [facility] Assists [place] cancer patients and their families through  emotional , educational and financial support.  336-427-4357 ? [place] DSS Where to apply for food stamps, Medicaid and utility assistance. 336-342-1394 ? [facility]: Transportation to medical appointments. 336-347-2287 ? Social Security Administration: May apply for disability if have a Stage IV cancer. 336-342-7796 1-800-772-1213 ? [place] Aging, Disability and Transit Services: Assists with nutrition, care and transit needs. 336-349-2343  Cancer Center Support Programs: @[email or web address]@ > Cancer Support Group  2nd Tuesday of the month 1pm-2pm, Journey Room  > Creative Journey  3rd Tuesday of the month 1130am-1pm, Journey Room  > Look Good Feel Better  1st Wednesday of the month 10am-12 noon, Journey Room (Call American Cancer Society to register 1-800-395-5775)    

## 2017-06-01 NOTE — Progress Notes (Signed)
[place] [facility] [place], [place] [address]  No diagnosis found.  CURRENT THERAPY: Surveillance per NCCN guidelines  INTERVAL HISTORY: [name] 56 y.o. 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 His surgery was in July 2008.   Patient presents today for his annual follow-up. He states he has been doing well. Denies any melena or hematochezia. He takes MiraLAX for constipation.  Patient's last colonoscopy was on 06/03/16 which demonstrated evidence of a prior surgical anastomosis at 25 cm from the anal verge. Anastomosis appeared normal. A 4 mm sessile polyp was found a descending colon, it was resected and surgical path demonstrated tubular adenoma without evidence of malignancy. The remainder of the colon mucosa appeared normal. Patient has grade 1 hemorrhoids.    Adenocarcinoma of sigmoid colon   07/08/2007 Surgery    Sigmoid colectomy- invasive, moderately differentiated adenocarcinoma, with invasion into the pericolonic adipose tissue and focally extyends to serousal surface. 6/22 lymph nodes.  Max tumor size was 4.0 cm.      08/25/2007 - 12/16/2007 Chemotherapy    Oxaliplatin + Xeloda 1800 mg BID x 14 days      04/14/2016 Imaging    CT abd/pelvis- Small periumbilical hernia contains a loop of small bowel without obstruction. Single wall of the bowel enters the hernia sac which can predispose to obstruction. No significant change in this pattern compared to CT of 01/09/2013.        Past Medical History:  Diagnosis Date  . Adenocarcinoma of sigmoid colon 2008  . Carpal tunnel syndrome   . GERD (gastroesophageal reflux disease)   . Hypokalemia   . Shortness of breath dyspnea    on  exertion  . Umbilical hernia     has Adenocarcinoma of sigmoid colon; Hematochezia; Erosive esophagitis; Constipation; History of colonic polyps; and Incisional hernia on his problem list.     is allergic to codeine.  [name] had no medications administered during this visit.  Past Surgical History:  Procedure Laterality Date  . APPENDECTOMY  1982  . CARPAL TUNNEL RELEASE    . colon resection    . COLONOSCOPY  12/30/2009   Dr. Gala Romney- s/p segmental L colon resection with anastomosis identified at 22cm. no adenomatous change or malignancy identified.   . COLONOSCOPY   01/11/2008   RMR:  Colonic mucosa from the colostomy to the cecum appeared normal.  . COLONOSCOPY   07/09/2008   RMR: Normal rectum, surgical anastomosis was 22 cm, polypoid tissue at anastomosis that was a recurrent neoplasm, it was removed with hot snare.  The remainder of residual colon appeared normal.  Relative poor prep on the right side made exam more challenging.  Marland Kitchen COLONOSCOPY  12/16/2012   Procedure: COLONOSCOPY;  Surgeon: Daneil Dolin, MD;  Location: [facility];  Service: Endoscopy;  Laterality: N/A;  8:30  . COLONOSCOPY N/A 06/03/2016   Dr. Gala Romney: Normal surgical anastomosis at 25 cm from the anal verge. Grade 1 hemorrhoids. 4 mm tubular adenoma removed from the descending colon. Next colonoscopy June 2022.  . ESOPHAGOGASTRODUODENOSCOPY  08/11/12   Rourk-erosive reflux esophagitis, distal esophageal ring/superimposed stricture s/p 38F [place], hiatal hernia, gastritis/duodenitis secondary to NSAIDs. Biopsy benign.  Marland Kitchen  FLEXIBLE SIGMOIDOSCOPY    07/08/2007   RMR: Lobulated 1 cm rectal polyp at 10 cm, resected as described above/ Multiple distal sigmoid diminutive polyps ablated with the tip of hot snare cautery unit/Obstructing apple core neoplasm beginning at 35 cm from the anal verge, biopsied multiple times  . INCISIONAL HERNIA REPAIR N/A 09/10/2016   Procedure: LAPAROSCOPIC LYSIS OF ADHESIONS; LAPAROSCOPIC  REPAIR OF INCISIONAL HERNIA REPAIR WITH MESH;  Surgeon: Jackolyn Confer, MD;  Location: [facility];  Service: General;  Laterality: N/A;  . INSERTION OF MESH N/A 09/10/2016   Procedure: INSERTION OF MESH;  Surgeon: Jackolyn Confer, MD;  Location: [facility];  Service: General;  Laterality: N/A;    ROS:   14 point review of systems was performed and is negative except as detailed under history of present illness and above   PHYSICAL EXAMINATION  ECOG PERFORMANCE STATUS: 0 - Asymptomatic  Vitals:   06/01/17 1252  BP: 121/76  Pulse: 80  Resp: 18  Temp: 97.5 F (36.4 C)    Physical Exam  Constitutional: He is oriented to person, place, and time and well-developed, well-nourished, and in no distress. No distress.  HENT:  Head: Normocephalic and atraumatic.  Mouth/Throat: No oropharyngeal exudate.  Eyes: Conjunctivae are normal. Pupils are equal, round, and reactive to light. No scleral icterus.  Neck: Normal range of motion. Neck supple. No JVD present.  Cardiovascular: Normal rate, regular rhythm and normal heart sounds.  Exam reveals no gallop and no friction rub.   No murmur heard. Pulmonary/Chest: Breath sounds normal. No respiratory distress. He has no wheezes. He has no rales.  Abdominal: Soft. Bowel sounds are normal. He exhibits no distension. There is no tenderness. There is no guarding.  Midline well healed surgical scar  Musculoskeletal: He exhibits no edema or tenderness.  Lymphadenopathy:    He has no cervical adenopathy.  Neurological: He is alert and oriented to person, place, and time. No cranial nerve deficit.  Skin: Skin is warm and dry. No rash noted. No erythema. No pallor.  Psychiatric: Affect and judgment normal.      LABORATORY DATA: I have reviewed the data as listed.  CBC    Component Value Date/Time   WBC 7.3 06/01/2017 1228   RBC 5.49 06/01/2017 1228   HGB 15.6 06/01/2017 1228   HCT 46.3 06/01/2017 1228   PLT 189 06/01/2017 1228   MCV 84.3  06/01/2017 1228   MCH 28.4 06/01/2017 1228   MCHC 33.7 06/01/2017 1228   RDW 14.4 06/01/2017 1228   LYMPHSABS 2.5 06/01/2017 1228   MONOABS 0.5 06/01/2017 1228   EOSABS 0.3 06/01/2017 1228   BASOSABS 0.1 06/01/2017 1228      Chemistry      Component Value Date/Time   NA 138 06/01/2017 1228   K 3.5 06/01/2017 1228   CL 102 06/01/2017 1228   CO2 29 06/01/2017 1228   BUN 16 06/01/2017 1228   CREATININE 1.11 06/01/2017 1228      Component Value Date/Time   CALCIUM 8.8 (L) 06/01/2017 1228   ALKPHOS 75 06/01/2017 1228   AST 27 06/01/2017 1228   ALT 32 06/01/2017 1228   BILITOT 0.4 06/01/2017 1228     Lab Results  Component Value Date   CEA 0.8 04/14/2016     RADIOGRAPHIC STUDIES:  01/09/2013  *RADIOLOGY REPORT*  Clinical Data: Carcinoma sigmoid colon. Chemotherapy in surgery  2008.  CT CHEST, ABDOMEN AND PELVIS WITH CONTRAST  Technique: Multidetector CT imaging of the chest, abdomen and  pelvis was performed following the standard protocol during bolus  administration of intravenous contrast.  Contrast: 129mL OMNIPAQUE IOHEXOL 300 MG/ML SOLN  Comparison: CT 12/29/2011  CT CHEST  Findings: No axillary or supraclavicular lymphadenopathy. No  mediastinal or hilar lymphadenopathy. No pericardial fluid.  Calcified perihilar lymph nodes. No pericardial fluid. Esophagus  is normal. There is a small 11 mm low density lesion within the  left lobe the thyroid gland which is not changed.  No suspicious pulmonary nodules.  IMPRESSION:  1. Stable exam the chest.  2. No evidence metastasis.  CT ABDOMEN AND PELVIS  Findings: There is a vague low density lesion adjacent to the  falciform ligament in the left hepatic lobe measuring 10 mm  unchanged from comparison ( image 58). There are two < 5 mm  lesions in the superior left hepatic lobe which also unchanged. No  new hepatic lesions are present. Gallbladder, pancreas, spleen,  adrenal glands, and kidneys are normal.  The  stomach, small bowel, cecum are normal. There is surgical  anastomosis in the sigmoid colon proximally without evidence of  nodularity obstruction.  There is a small umbilical hernia which contains a loop of  nonobstructed small bowel (image 88) which is similar to prior.  The left lower quadrant ostomy site is normal.  Within the pelvis, there is no free fluid or lymphadenopathy.  Bilateral fat filled inguinal hernias are present. The prostate  gland bladder normal.  IMPRESSION:  1. No evidence of colon cancer recurrence.  2. Stable anastomoses in the sigmoid colon.  3. Small umbilical hernia contains a nonobstructed loop of small  bowel.  4. Stable low density lesions within the liver.  Original Report Authenticated By: Suzy Bouchard, M.D.    ASSESSMENT:  1. Stage III adenocarcinoma of the sigmoid colon with a 4 cm cancer with 6 of 22 positive lymph nodes, grade 2, LV I was found, with surgery followed by chemotherapy consisting of oxaliplatin and capecitabine for 6 cycles. His surgery was in July 2008.     PLAN:  Patient will be 10 years out from his initial diagnosis as of this July. He is cured. He no longer needs to do his annual follow-ups here. I have advised him to continue going for colonoscopies with Dr. Gala Romney as scheduled. We will call him with his CEA result from today. His CBC was reviewed with him and was within normal limits. Return to clinic when necessary.  NCCN guidelines for surveillance for Colon cancer are as follows (1.2017):  B. Stage II, Stage III 1. H+P every 3-6 months x 2 years and then every 6 months for a total of 5 years  2. CEA every 3-6 months x 2 years and then every 6 months for a total of 5 years  3. CT CAP every 6-12 months (category 2B for frequency < 12 months) for a total of 5 years . 4.  Colonoscopy in 1 year except if no preoperative colonoscopy due to obstructing lesion, colonoscopy in 3-6 months.  A. If advanced adenoma, repeat in 1  year B. If no advanced adenoma, repeat in 3 years, then every 5 years 5. PET/CT scan is not recommended.  All questions were answered. The patient knows to call the clinic with any problems, questions or concerns. We can certainly see the patient much sooner if necessary.    This note was signed electronically Twana First, MD

## 2017-06-02 LAB — CEA: CEA: 1.3 ng/mL (ref 0.0–4.7)

## 2017-06-15 ENCOUNTER — Other Ambulatory Visit: Payer: Self-pay

## 2017-06-15 ENCOUNTER — Telehealth: Payer: Self-pay

## 2017-06-15 DIAGNOSIS — K824 Cholesterolosis of gallbladder: Secondary | ICD-10-CM

## 2017-06-15 NOTE — Telephone Encounter (Signed)
Pt called office today and LMOVM that he received letter for [place] recall. [place] abd limited RUQ scheduled for 06/23/17 at 12:30pm, pt to arrive at 12:15pm. NPO 6 hours prior to test. Called and informed pt of [place] appt.  He also said he is having GERD. He takes Omeprazole 20mg  (2) twice a day. Today he took 4 Omeprazole at one time and they lasted about an hour. Offered him OV with [facility] since he hasn't been seen in office since 07/17/16. [facility]'s first available OV is 08/12/17. He said he would see if he could be seen at the [place]. Routing to [facility].

## 2017-06-17 NOTE — Telephone Encounter (Signed)
Called and informed pt. He doesn't want to stop the Omeprazole. He gets Omeprazole for free through the [place] and if he changes to something else the [place] will change it back to Omeprazole. He also doesn't want Dexilant. He said he took Sterrett in the past and it made him constipated. Michela Pitcher he is feeling some better now. He has stopped drinking diet Dr. Malachi Bonds and started drinking water.

## 2017-06-17 NOTE — Telephone Encounter (Signed)
noted 

## 2017-06-17 NOTE — Telephone Encounter (Signed)
Agree the patient needs to be seen but in the interim, we can try different PPI if he would like. If he is having any alarm symptoms, like chest pain, shortness of breath, diaphoresis he should be seen in the ED as it sounds like he has had pretty severe symptoms to be taking high dose omeprazole.   We can send in [place] 60mg  once daily before breakfast, #30, 3 refills to his pharmacy of choice.

## 2017-06-23 ENCOUNTER — Ambulatory Visit (HOSPITAL_COMMUNITY)
Admission: RE | Admit: 2017-06-23 | Discharge: 2017-06-23 | Disposition: A | Discharge status: Home / Self Care | Payer: Federal, State, Local not specified - PPO | Source: Ambulatory Visit | Attending: Gastroenterology | Admitting: Gastroenterology

## 2017-06-23 DIAGNOSIS — K760 Fatty (change of) liver, not elsewhere classified: Secondary | ICD-10-CM | POA: Diagnosis not present

## 2017-06-23 DIAGNOSIS — K824 Cholesterolosis of gallbladder: Secondary | ICD-10-CM | POA: Diagnosis present

## 2017-06-24 ENCOUNTER — Telehealth: Payer: Self-pay

## 2017-06-24 NOTE — Telephone Encounter (Signed)
U/s was done yesterday. Results to follow within 5-7 business days.

## 2017-06-24 NOTE — Progress Notes (Signed)
Please let patient know his gb polyps is similar in size to one year ago. gb polyp does not need further f/u given stability. He also has adenomyomatosis of the gallbladder wall which was not mentioned one year ago at time of u/s. Usually benign finding.   Is he having any problems with ruq or epigastric pain, n/v?

## 2017-06-24 NOTE — Telephone Encounter (Signed)
Pt is calling to see about results from his [place].

## 2017-06-25 NOTE — Progress Notes (Signed)
Probably the best thing to do is let him see a surgeon to discuss/get recommendations. [facility] for referral to surgeon of choice.

## 2017-06-28 ENCOUNTER — Other Ambulatory Visit: Payer: Self-pay

## 2017-06-28 DIAGNOSIS — K8021 Calculus of gallbladder without cholecystitis with obstruction: Secondary | ICD-10-CM

## 2017-06-28 NOTE — Telephone Encounter (Signed)
See result note.  

## 2017-07-13 ENCOUNTER — Ambulatory Visit (INDEPENDENT_AMBULATORY_CARE_PROVIDER_SITE_OTHER): Payer: Federal, State, Local not specified - PPO | Admitting: General Surgery

## 2017-07-13 ENCOUNTER — Encounter: Payer: Self-pay | Admitting: General Surgery

## 2017-07-13 VITALS — BP 127/74 | HR 60 | Temp 98.4°F | Resp 18 | Ht 70.0 in | Wt 219.0 lb

## 2017-07-13 DIAGNOSIS — K824 Cholesterolosis of gallbladder: Secondary | ICD-10-CM | POA: Diagnosis not present

## 2017-07-13 NOTE — Patient Instructions (Signed)
Gastroesophageal Reflux Disease, Adult Normally, food travels down the esophagus and stays in the stomach to be digested. However, when a person has gastroesophageal reflux disease (GERD), food and stomach acid move back up into the esophagus. When this happens, the esophagus becomes sore and inflamed. Over time, GERD can create small holes (ulcers) in the lining of the esophagus. What are the causes? This condition is caused by a problem with the muscle between the esophagus and the stomach (lower esophageal sphincter, or LES). Normally, the LES muscle closes after food passes through the esophagus to the stomach. When the LES is weakened or abnormal, it does not close properly, and that allows food and stomach acid to go back up into the esophagus. The LES can be weakened by certain dietary substances, medicines, and medical conditions, including:  Tobacco use.  Pregnancy.  Having a hiatal hernia.  Heavy alcohol use.  Certain foods and beverages, such as coffee, chocolate, onions, and peppermint.  What increases the risk? This condition is more likely to develop in:  People who have an increased body weight.  People who have connective tissue disorders.  People who use NSAID medicines.  What are the signs or symptoms? Symptoms of this condition include:  Heartburn.  Difficult or painful swallowing.  The feeling of having a lump in the throat.  Abitter taste in the mouth.  Bad breath.  Having a large amount of saliva.  Having an upset or bloated stomach.  Belching.  Chest pain.  Shortness of breath or wheezing.  Ongoing (chronic) cough or a night-time cough.  Wearing away of tooth enamel.  Weight loss.  Different conditions can cause chest pain. Make sure to see your health care provider if you experience chest pain. How is this diagnosed? Your health care provider will take a medical history and perform a physical exam. To determine if you have mild or severe  GERD, your health care provider may also monitor how you respond to treatment. You may also have other tests, including:  An endoscopy toexamine your stomach and esophagus with a small camera.  A test thatmeasures the acidity level in your esophagus.  A test thatmeasures how much pressure is on your esophagus.  A barium swallow or modified barium swallow to show the shape, size, and functioning of your esophagus.  How is this treated? The goal of treatment is to help relieve your symptoms and to prevent complications. Treatment for this condition may vary depending on how severe your symptoms are. Your health care provider may recommend:  Changes to your diet.  Medicine.  Surgery.  Follow these instructions at home: Diet  Follow a diet as recommended by your health care provider. This may involve avoiding foods and drinks such as: ? Coffee and tea (with or without caffeine). ? Drinks that containalcohol. ? Energy drinks and sports drinks. ? Carbonated drinks or sodas. ? Chocolate and cocoa. ? Peppermint and mint flavorings. ? Garlic and onions. ? Horseradish. ? Spicy and acidic foods, including peppers, chili powder, curry powder, vinegar, hot sauces, and barbecue sauce. ? Citrus fruit juices and citrus fruits, such as oranges, lemons, and limes. ? Tomato-based foods, such as red sauce, chili, salsa, and pizza with red sauce. ? Fried and fatty foods, such as donuts, french fries, potato chips, and high-fat dressings. ? High-fat meats, such as hot dogs and fatty cuts of red and white meats, such as rib eye steak, sausage, ham, and bacon. ? High-fat dairy items, such as whole milk,   butter, and cream cheese.  Eat small, frequent meals instead of large meals.  Avoid drinking large amounts of liquid with your meals.  Avoid eating meals during the 2-3 hours before bedtime.  Avoid lying down right after you eat.  Do not exercise right after you eat. General  instructions  Pay attention to any changes in your symptoms.  Take over-the-counter and prescription medicines only as told by your health care provider. Do not take aspirin, ibuprofen, or other NSAIDs unless your health care provider told you to do so.  Do not use any tobacco products, including cigarettes, chewing tobacco, and e-cigarettes. If you need help quitting, ask your health care provider.  Wear loose-fitting clothing. Do not wear anything tight around your waist that causes pressure on your abdomen.  Raise (elevate) the head of your bed 6 inches (15cm).  Try to reduce your stress, such as with yoga or meditation. If you need help reducing stress, ask your health care provider.  If you are overweight, reduce your weight to an amount that is healthy for you. Ask your health care provider for guidance about a safe weight loss goal.  Keep all follow-up visits as told by your health care provider. This is important. Contact a health care provider if:  You have new symptoms.  You have unexplained weight loss.  You have difficulty swallowing, or it hurts to swallow.  You have wheezing or a persistent cough.  Your symptoms do not improve with treatment.  You have a hoarse voice. Get help right away if:  You have pain in your arms, neck, jaw, teeth, or back.  You feel sweaty, dizzy, or light-headed.  You have chest pain or shortness of breath.  You vomit and your vomit looks like blood or coffee grounds.  You faint.  Your stool is bloody or black.  You cannot swallow, drink, or eat. This information is not intended to replace advice given to you by your health care provider. Make sure you discuss any questions you have with your health care provider. Document Released: 09/09/2005 Document Revised: 04/29/2016 Document Reviewed: 03/27/2015 Elsevier Interactive Patient Education  2017 [employer] Hiatal Hernia A hiatal hernia occurs when part of the stomach slides  above the muscle that separates the abdomen from the chest (diaphragm). A person can be born with a hiatal hernia (congenital), or it may develop over time. In almost all cases of hiatal hernia, only the top part of the stomach pushes through the diaphragm. Many people have a hiatal hernia with no symptoms. The larger the hernia, the more likely it is that you will have symptoms. In some cases, a hiatal hernia allows stomach acid to flow back into the tube that carries food from your mouth to your stomach (esophagus). This may cause heartburn symptoms. Severe heartburn symptoms may mean that you have developed a condition called gastroesophageal reflux disease (GERD). What are the causes? This condition is caused by a weakness in the opening (hiatus) where the esophagus passes through the diaphragm to attach to the upper part of the stomach. A person may be born with a weakness in the hiatus, or a weakness can develop over time. What increases the risk? This condition is more likely to develop in:  Older people. Age is a major risk factor for a hiatal hernia, especially if you are over the age of 77.  Pregnant women.  People who are overweight.  People who have frequent constipation.  What are the signs or symptoms?  Symptoms of this condition usually develop in the form of GERD symptoms. Symptoms include:  Heartburn.  Belching.  Indigestion.  Trouble swallowing.  Coughing or wheezing.  Sore throat.  Hoarseness.  Chest pain.  Nausea and vomiting.  How is this diagnosed? This condition may be diagnosed during testing for GERD. Tests that may be done include:  X-rays of your stomach or chest.  An upper gastrointestinal (GI) series. This is an X-ray exam of your GI tract that is taken after you swallow a chalky liquid that shows up clearly on the X-ray.  Endoscopy. This is a procedure to look into your stomach using a thin, flexible tube that has a tiny camera and light on the  end of it.  How is this treated? This condition may be treated by:  Dietary and lifestyle changes to help reduce GERD symptoms.  Medicines. These may include: ? Over-the-counter antacids. ? Medicines that make your stomach empty more quickly. ? Medicines that block the production of stomach acid (H2 blockers). ? Stronger medicines to reduce stomach acid (proton pump inhibitors).  Surgery to repair the hernia, if other treatments are not helping.  If you have no symptoms, you may not need treatment. Follow these instructions at home: Lifestyle and activity  Do not use any products that contain nicotine or tobacco, such as cigarettes and e-cigarettes. If you need help quitting, ask your health care provider.  Try to achieve and maintain a healthy body weight.  Avoid putting pressure on your abdomen. Anything that puts pressure on your abdomen increases the amount of acid that may be pushed up into your esophagus. ? Avoid bending over, especially after eating. ? Raise the head of your bed by putting blocks under the legs. This keeps your head and esophagus higher than your stomach. ? Do not wear tight clothing around your chest or stomach. ? Try not to strain when having a bowel movement, when urinating, or when lifting heavy objects. Eating and drinking  Avoid foods that can worsen GERD symptoms. These may include: ? Fatty foods, like fried foods. ? Citrus fruits, like oranges or lemon. ? Other foods and drinks that contain acid, like orange juice or tomatoes. ? Spicy food. ? Chocolate.  Eat frequent small meals instead of three large meals a day. This helps prevent your stomach from getting too full. ? Eat slowly. ? Do not lie down right after eating. ? Do not eat 1-2 hours before bed.  Do not drink beverages with caffeine. These include cola, coffee, cocoa, and tea.  Do not drink alcohol. General instructions  Take over-the-counter and prescription medicines only as  told by your health care provider.  Keep all follow-up visits as told by your health care provider. This is important. Contact a health care provider if:  Your symptoms are not controlled with medicines or lifestyle changes.  You are having trouble swallowing.  You have coughing or wheezing that will not go away. Get help right away if:  Your pain is getting worse.  Your pain spreads to your arms, neck, jaw, teeth, or back.  You have shortness of breath.  You sweat for no reason.  You feel sick to your stomach (nauseous) or you vomit.  You vomit blood.  You have bright red blood in your stools.  You have black, tarry stools. This information is not intended to replace advice given to you by your health care provider. Make sure you discuss any questions you have with your health care  provider. Document Released: 02/20/2004 Document Revised: 11/23/2016 Document Reviewed: 11/23/2016 Elsevier Interactive Patient Education  [employer].

## 2017-07-13 NOTE — Progress Notes (Signed)
[name]; [id number]; 1961-04-26   HPI   Patient is a 56 year old white male who is referred by care by Dr. Gala Romney for evaluation and treatment of a gallbladder polyp.  This was seen on follow-up ultrasound of the gallbladder.  Patient states he primarily has heartburn and epigastric pain, which she is taking high dose Prilosec for.  He denies any right upper quadrant abdominal pain, nausea, fatty food intolerance, fever, chills, or jaundice.  He does have 5/10 pain, primarily from his heartburn.  Ultrasound of the gallbladder revealed a 4 mm polyp with question adenomyosis in the wall of the gallbladder.  He has a history of colon cancer, recent laparoscopic incisional herniorrhaphy with mesh which required a large piece of mesh for repair and lysis of adhesions. Past Medical History:  Diagnosis Date  . Adenocarcinoma of sigmoid colon 2008  . Carpal tunnel syndrome   . GERD (gastroesophageal reflux disease)   . Hypokalemia   . Shortness of breath dyspnea    on exertion  . Umbilical hernia     Past Surgical History:  Procedure Laterality Date  . APPENDECTOMY  1982  . CARPAL TUNNEL RELEASE    . colon resection    . COLONOSCOPY  12/30/2009   Dr. Gala Romney- s/p segmental L colon resection with anastomosis identified at 22cm. no adenomatous change or malignancy identified.   . COLONOSCOPY   01/11/2008   RMR:  Colonic mucosa from the colostomy to the cecum appeared normal.  . COLONOSCOPY   07/09/2008   RMR: Normal rectum, surgical anastomosis was 22 cm, polypoid tissue at anastomosis that was a recurrent neoplasm, it was removed with hot snare.  The remainder of residual colon appeared normal.  Relative poor prep on the right side made exam more challenging.  Marland Kitchen COLONOSCOPY  12/16/2012   Procedure: COLONOSCOPY;  Surgeon: Daneil Dolin, MD;  Location: [facility];  Service: Endoscopy;  Laterality: N/A;  8:30  . COLONOSCOPY N/A 06/03/2016   Dr. Gala Romney: Normal surgical anastomosis at 25 cm from  the anal verge. Grade 1 hemorrhoids. 4 mm tubular adenoma removed from the descending colon. Next colonoscopy June 2022.  . ESOPHAGOGASTRODUODENOSCOPY  08/11/12   Rourk-erosive reflux esophagitis, distal esophageal ring/superimposed stricture s/p 3F Maloney, hiatal hernia, gastritis/duodenitis secondary to NSAIDs. Biopsy benign.  Marland Kitchen FLEXIBLE SIGMOIDOSCOPY    07/08/2007   RMR: Lobulated 1 cm rectal polyp at 10 cm, resected as described above/ Multiple distal sigmoid diminutive polyps ablated with the tip of hot snare cautery unit/Obstructing apple core neoplasm beginning at 35 cm from the anal verge, biopsied multiple times  . INCISIONAL HERNIA REPAIR N/A 09/10/2016   Procedure: LAPAROSCOPIC LYSIS OF ADHESIONS; LAPAROSCOPIC REPAIR OF INCISIONAL HERNIA REPAIR WITH MESH;  Surgeon: Jackolyn Confer, MD;  Location: [facility];  Service: General;  Laterality: N/A;  . INSERTION OF MESH N/A 09/10/2016   Procedure: INSERTION OF MESH;  Surgeon: Jackolyn Confer, MD;  Location: [facility];  Service: General;  Laterality: N/A;    Family History  Problem Relation Age of Onset  . Emphysema Father   . Heart attack Father     Current Outpatient Prescriptions on File Prior to Visit  Medication Sig Dispense Refill  . omeprazole (PRILOSEC) 20 MG capsule Take 20 mg by mouth daily. Takes 2 caps in the morning and 1 at night    . polyethylene glycol (MIRALAX / GLYCOLAX) packet Take 17 g by mouth 2 (two) times daily.    . simvastatin (ZOCOR) 10 MG tablet Take 10  mg by mouth daily.     No current facility-administered medications on file prior to visit.     Allergies  Allergen Reactions  . Codeine Nausea And Vomiting    Pt prefers to have food on stomach if he needs to take med     History  Alcohol Use No    History  Smoking Status  . Former Smoker  Smokeless Tobacco  . Never Used    Review of Systems  Constitutional: Negative.   HENT: Negative.   Eyes: Negative.   Respiratory: Negative.   Cardiovascular:  Negative.   Gastrointestinal: Positive for abdominal pain and heartburn.  Genitourinary: Negative.   Musculoskeletal: Negative.   Skin: Negative.   Neurological: Negative.   Endo/Heme/Allergies: Negative.   Psychiatric/Behavioral: Negative.     Objective   Vitals:   07/13/17 1330  BP: 127/74  Pulse: 60  Resp: 18  Temp: 98.4 F (36.9 C)    Physical Exam  Constitutional: He is oriented to person, place, and time and well-developed, well-nourished, and in no distress.  HENT:  Head: Normocephalic and atraumatic.  Neck: Normal range of motion. Neck supple.  Cardiovascular: Normal rate, regular rhythm and normal heart sounds.   No murmur heard. Pulmonary/Chest: Effort normal and breath sounds normal. He has no wheezes. He has no rales.  Abdominal: Soft. Bowel sounds are normal. He exhibits no distension. There is no tenderness. There is no rebound.  Neurological: He is alert and oriented to person, place, and time.  Skin: Skin is warm and dry.  Vitals reviewed.    Ultrasound report reviewed. Assessment   Gallbladder polyp, adenomyomatosis of gallbladder wall Plan    in terms of the gallbladder polyp, it has not changed in size, thus there is no need for cholecystectomy.  I did research the other finding, and cholecystectomy is only warranted when the patient has biliary colic.  He does not exhibit any symptoms of biliary colic.  Laparoscopic approach will be difficult due to his multiple previous surgeries and hernia repair.  Would recommend follow-up ultrasound in 1 year.  Should he become more symptomatic, sooner would be indicated.  I did tell the patient that he has a low risk of gallbladder cancer, though it is not 100%.  I would not recommend cholecystectomy at this time.  Patient is fine with that.  Follow-up as needed.  Patient's will be contacting GI as he is having worsening heartburn and a known history of a hiatal hernia.

## 2017-07-25 NOTE — Progress Notes (Signed)
Patient saw Dr. Arnoldo Morale and did not have his gb out.  He recommended repeat ruq u/s in 06/2018. Please NIC.

## 2017-07-26 NOTE — Progress Notes (Signed)
PATIENT ON RECALL FOR ULTRASOUND  °

## 2017-11-25 ENCOUNTER — Encounter: Payer: Self-pay | Admitting: Internal Medicine

## 2018-06-20 ENCOUNTER — Telehealth: Payer: Self-pay | Admitting: Internal Medicine

## 2018-06-20 NOTE — Telephone Encounter (Signed)
RECALL FOR ULTRASOUND 

## 2018-06-20 NOTE — Telephone Encounter (Signed)
Letter mailed

## 2019-09-27 ENCOUNTER — Encounter: Payer: Self-pay | Admitting: Internal Medicine

## 2019-10-27 ENCOUNTER — Encounter: Payer: Self-pay | Admitting: *Deleted

## 2019-10-27 ENCOUNTER — Encounter: Payer: Self-pay | Admitting: Internal Medicine

## 2019-10-27 ENCOUNTER — Other Ambulatory Visit: Payer: Self-pay

## 2019-10-27 ENCOUNTER — Other Ambulatory Visit: Payer: Self-pay | Admitting: *Deleted

## 2019-10-27 ENCOUNTER — Ambulatory Visit (INDEPENDENT_AMBULATORY_CARE_PROVIDER_SITE_OTHER): Payer: No Typology Code available for payment source | Admitting: Internal Medicine

## 2019-10-27 VITALS — BP 115/73 | HR 58 | Temp 97.0°F | Ht 70.0 in | Wt 215.0 lb

## 2019-10-27 DIAGNOSIS — R1310 Dysphagia, unspecified: Secondary | ICD-10-CM

## 2019-10-27 DIAGNOSIS — R1319 Other dysphagia: Secondary | ICD-10-CM

## 2019-10-27 DIAGNOSIS — C187 Malignant neoplasm of sigmoid colon: Secondary | ICD-10-CM | POA: Diagnosis not present

## 2019-10-27 DIAGNOSIS — K219 Gastro-esophageal reflux disease without esophagitis: Secondary | ICD-10-CM | POA: Diagnosis not present

## 2019-10-27 DIAGNOSIS — G622 Polyneuropathy due to other toxic agents: Secondary | ICD-10-CM

## 2019-10-27 NOTE — Patient Instructions (Signed)
Begin Linzess 72 - cap daily; taper off miralax  Schedule an EGD with esophageal dilation (refractory GERD and dysphagia) conscious sedation   Repeat colonoscopy in 2022  No change in omeprazole for now but recommendations may change  Call me in 2 weeks and let me know how Linzess is working.  Further recommendations to follow

## 2019-10-27 NOTE — Progress Notes (Signed)
Primary Care Physician:  Lonzo Cloud, MD Primary Gastroenterologist:  Dr. Gala Romney  Pre-Procedure History & Physical: HPI:  [name] is a 58 y.o. male here for follow-up of GERD.  Has not been seen here for about 3 years.  Previously on pantoprazole for reflux; changed about 3 years ago to omeprazole 40 and 20 daily - now on 40 twice daily  -  continues to have breakthrough symptoms.  Recurrent esophageal dysphagia as well.  No melena or rectal bleeding.  History of colon cancer - status post segmental resection of the sigmoid about 17 years ago;  he is due for surveillance examination 2022  He does note recurrent esophageal dysphagia.  Peptic stricture dilated in 2013 by me.  History of a gastric  /  duodenal erosions and ulcerations.  H. pylori negative at that time.  He continues to take occasional nonsteroidal agent for various aches and pains.  Chronic constipation  - quite a bit of bloating lately.  Takes  MiraLAX twice daily.  Prior gallbladder ultrasound was normal aside from a single 4 mm gallbladder polyp for which further surveillance not recommended.  He just retired from the [employer] after 40 years of service.  Past Medical History:  Diagnosis Date  . Adenocarcinoma of sigmoid colon 2008  . Carpal tunnel syndrome   . GERD (gastroesophageal reflux disease)   . Hypokalemia   . Shortness of breath dyspnea    on exertion  . Umbilical hernia     Past Surgical History:  Procedure Laterality Date  . APPENDECTOMY  1982  . CARPAL TUNNEL RELEASE    . colon resection    . COLONOSCOPY  12/30/2009   Dr. Gala Romney- s/p segmental L colon resection with anastomosis identified at 22cm. no adenomatous change or malignancy identified.   . COLONOSCOPY   01/11/2008   RMR:  Colonic mucosa from the colostomy to the cecum appeared normal.  . COLONOSCOPY   07/09/2008   RMR: Normal rectum, surgical anastomosis was 22 cm, polypoid tissue at anastomosis that was a recurrent neoplasm, it was  removed with hot snare.  The remainder of residual colon appeared normal.  Relative poor prep on the right side made exam more challenging.  Marland Kitchen COLONOSCOPY  12/16/2012   Procedure: COLONOSCOPY;  Surgeon: Daneil Dolin, MD;  [facility];  Service: Endoscopy;  Laterality: N/A;  8:30  . COLONOSCOPY N/A 06/03/2016   Dr. Gala Romney: Normal surgical anastomosis at 25 cm from the anal verge. Grade 1 hemorrhoids. 4 mm tubular adenoma removed from the descending colon. Next colonoscopy June 2022.  . ESOPHAGOGASTRODUODENOSCOPY  08/11/12   [place]-erosive reflux esophagitis, distal esophageal ring/superimposed stricture s/p 32F Maloney, hiatal hernia, gastritis/duodenitis secondary to NSAIDs. Biopsy benign.  Marland Kitchen FLEXIBLE SIGMOIDOSCOPY    07/08/2007   RMR: Lobulated 1 cm rectal polyp at 10 cm, resected as described above/ Multiple distal sigmoid diminutive polyps ablated with the tip of hot snare cautery unit/Obstructing apple core neoplasm beginning at 35 cm from the anal verge, biopsied multiple times  . INCISIONAL HERNIA REPAIR N/A 09/10/2016   Procedure: LAPAROSCOPIC LYSIS OF ADHESIONS; LAPAROSCOPIC REPAIR OF INCISIONAL HERNIA REPAIR WITH MESH;  Surgeon: Jackolyn Confer, MD;  Location: [facility];  Service: General;  Laterality: N/A;  . INSERTION OF MESH N/A 09/10/2016   Procedure: INSERTION OF MESH;  Surgeon: Jackolyn Confer, MD;  Location: [facility];  Service: General;  Laterality: N/A;    Prior to Admission medications   Medication Sig Start Date End  Date Taking? Authorizing Provider  cyclobenzaprine (FLEXERIL) 10 MG tablet Take 10 mg by mouth 2 (two) times daily as needed for muscle spasms.   Yes [provider]  omeprazole (PRILOSEC) 20 MG capsule Take 40 mg by mouth 2 (two) times daily before a meal.    Yes [provider]  polyethylene glycol (MIRALAX / GLYCOLAX) packet Take 17 g by mouth 2 (two) times daily.   Yes [provider]  simvastatin (ZOCOR) 10 MG tablet Take 10 mg by  mouth daily.   Yes [provider]    Allergies as of 10/27/2019 - Review Complete 07/13/2017  Allergen Reaction Noted  . Codeine Nausea And Vomiting 06/26/2011    Family History  Problem Relation Age of Onset  . Emphysema Father   . Heart attack Father     Social History   Socioeconomic History  . Marital status: Married    Spouse name: Not on file  . Number of children: 1  . Years of education: Not on file  . Highest education level: Not on file  Occupational History  . Occupation: Designer, jewellery: [employer]  . Financial resource strain: Not on file  . Food insecurity    Worry: Not on file    Inability: Not on file  . Transportation needs    Medical: Not on file    Non-medical: Not on file  Tobacco Use  . Smoking status: Former Research scientist (life sciences)  . Smokeless tobacco: Never Used  Substance and Sexual Activity  . Alcohol use: No  . Drug use: No  . Sexual activity: Yes  Lifestyle  . Physical activity    Days per week: Not on file    Minutes per session: Not on file  . Stress: Not on file  Relationships  . Social Herbalist on phone: Not on file    Gets together: Not on file    Attends religious service: Not on file    Active member of club or organization: Not on file    Attends meetings of clubs or organizations: Not on file    Relationship status: Not on file  . Intimate partner violence    Fear of current or ex partner: Not on file    Emotionally abused: Not on file    Physically abused: Not on file    Forced sexual activity: Not on file  Other Topics Concern  . Not on file  Social History Narrative   Lives with wife & daughter          Review of Systems: See HPI, otherwise negative ROS  Physical Exam: BP 115/73   Pulse (!) 58   Temp (!) 97 F (36.1 C) (Oral)   Ht 5\' 10"  (1.778 m)   Wt 215 lb (97.5 kg)   BMI 30.85 kg/m  General:   Alert,  Well-developed, well-nourished, pleasant and  cooperative in NAD Nose:  No deformity, discharge,  or lesions. Mouth:  No deformity or lesions. Neck:  Supple; no masses or thyromegaly. No significant cervical adenopathy. Lungs:  Clear throughout to auscultation.   No wheezes, crackles, or rhonchi. No acute distress. Heart:  Regular rate and rhythm; no murmurs, clicks, rubs,  or gallops. Abdomen: Non-distended, normal bowel sounds.  Soft and nontender without appreciable mass or hepatosplenomegaly.  Pulses:  Normal pulses noted. Extremities:  Without clubbing or edema.  Impression/Plan: Very pleasant 58 year old gentleman longstanding complicated GERD now with recurrent esophageal dysphagia  and breakthrough reflux symptoms.  He is on high-dose PPI therapy.  He will need further evaluation via EGD with possible esophageal dilation as feasible/appropriate in the near future.  History of sigmoid resection for limited stage colorectal cancer.  Clinically doing well; but chronically constipated.  Recommendations:  Begin Linzess 72 - cap daily; taper off miralax  Schedule an EGD with esophageal dilation (refractory GERD and dysphagia) conscious sedation   Repeat colonoscopy in 2022  No change in omeprazole for now but recommendations may change  Patient to call in 2 weeks and let me know how Linzess is working.  Further recommendations to follow      Notice: This dictation was prepared with Dragon dictation along with smaller phrase technology. Any transcriptional errors that result from this process are unintentional and may not be corrected upon review.

## 2019-10-29 DIAGNOSIS — G622 Polyneuropathy due to other toxic agents: Secondary | ICD-10-CM | POA: Insufficient documentation

## 2019-10-30 ENCOUNTER — Other Ambulatory Visit: Payer: Self-pay

## 2019-10-31 ENCOUNTER — Other Ambulatory Visit: Payer: Self-pay

## 2019-10-31 MED ORDER — LINACLOTIDE 72 MCG PO CAPS: 72.0000 ug | ORAL_CAPSULE | Freq: Every day | ORAL | 11 refills | Status: DC

## 2019-11-01 ENCOUNTER — Telehealth: Payer: Self-pay

## 2019-11-01 NOTE — Telephone Encounter (Signed)
Pt called and said [facility] was having some problems with his Linzess Rx and he wants to have it faxed to [place]. Said he left the number with [place].  He is aware that she is out for the day and Ok for her to take care of tomorrow.

## 2019-11-06 ENCOUNTER — Other Ambulatory Visit: Payer: Self-pay

## 2019-11-06 MED ORDER — LINACLOTIDE 72 MCG PO CAPS: 72.0000 ug | ORAL_CAPSULE | Freq: Every day | ORAL | 11 refills | Status: DC

## 2019-11-06 NOTE — Telephone Encounter (Signed)
RX for Linzess 72 mcg #30 one daily before breakfast with 11 rfs was faxed to the [facility] hospital. [phone]

## 2019-11-20 ENCOUNTER — Telehealth: Payer: Self-pay | Admitting: *Deleted

## 2019-11-20 NOTE — Telephone Encounter (Signed)
Spoke with pt. Linzess RX was faxed a couple weeks ago to the [place]. Will contact the [place] and give a verbal if the original fax wasn't received.

## 2019-11-20 NOTE — Telephone Encounter (Signed)
Pt following up on RX (Linzess)  that was sent to [place].  [phone]

## 2019-11-23 NOTE — Telephone Encounter (Signed)
[facility] and stated [facility] not [facility]. He gave this fax # [phone] to refax the Rx.

## 2019-11-23 NOTE — Telephone Encounter (Signed)
Spoke with pt. RX was faxed to [place]. Message was given to the [place] earlier to call back to confirm RX was received.

## 2019-11-24 NOTE — Telephone Encounter (Signed)
Documentation sent to [facility] via fax of pts medication tried and failed with ov noted.

## 2019-11-24 NOTE — Telephone Encounter (Signed)
Received call from the pharmacy at the [facility]. Linzess has to be approved through the [place] before they can provide this to him. They are needing clinical notes showing what he has tried/failed faxed to [phone].

## 2019-11-27 NOTE — Telephone Encounter (Signed)
Received a call from [place] from the [place]. Pt has only tried and failed Miralax and the [facility] isn't able to approve the Linzess until pt has tried a 1 month trial of Psyllium powder. If RMR, feels this medication isn't appropriate, we can call the [place] with a reason why it isn't suitable for the pt to try and they can try to approve the [place]. Please advise.

## 2019-11-28 NOTE — Telephone Encounter (Signed)
Spoke with pt. Pt is aware that he can try the 1 month trial of P.powder requested by the [place] and if it doesn't work well for him, they can submit a PA. RX faxed to the [place].

## 2019-11-28 NOTE — Telephone Encounter (Signed)
Psyllium is not my recommended first choice.  It is a relatively weak laxative.  Will have to deal with the [facility] rules.  We can recommend and prescribe standard dose of psyllium every day -take a total of 16 ounces of water with each dose.  Dispense for a month.  He can let us know in a month how that is working for him

## 2019-12-05 ENCOUNTER — Other Ambulatory Visit: Payer: Self-pay

## 2019-12-05 ENCOUNTER — Other Ambulatory Visit (HOSPITAL_COMMUNITY)
Admission: RE | Admit: 2019-12-05 | Discharge: 2019-12-05 | Disposition: A | Discharge status: Home / Self Care | Payer: Federal, State, Local not specified - PPO | Source: Ambulatory Visit | Attending: Internal Medicine | Admitting: Internal Medicine

## 2019-12-05 DIAGNOSIS — Z01812 Encounter for preprocedural laboratory examination: Secondary | ICD-10-CM | POA: Insufficient documentation

## 2019-12-05 DIAGNOSIS — Z20828 Contact with and (suspected) exposure to other viral communicable diseases: Secondary | ICD-10-CM | POA: Insufficient documentation

## 2019-12-05 LAB — SARS CORONAVIRUS 2 (TAT 6-24 HRS): SARS Coronavirus 2: NEGATIVE

## 2019-12-06 ENCOUNTER — Encounter (HOSPITAL_COMMUNITY): Payer: Self-pay | Admitting: Internal Medicine

## 2019-12-06 ENCOUNTER — Other Ambulatory Visit: Payer: Self-pay

## 2019-12-06 ENCOUNTER — Ambulatory Visit (HOSPITAL_COMMUNITY)
Admission: RE | Admit: 2019-12-06 | Discharge: 2019-12-06 | Disposition: A | Discharge status: Home / Self Care | Payer: No Typology Code available for payment source | Attending: Internal Medicine | Admitting: Internal Medicine

## 2019-12-06 ENCOUNTER — Encounter (HOSPITAL_COMMUNITY)
Admission: RE | Disposition: A | Discharge status: Home / Self Care | Payer: Self-pay | Source: Home / Self Care | Attending: Internal Medicine

## 2019-12-06 DIAGNOSIS — Z85038 Personal history of other malignant neoplasm of large intestine: Secondary | ICD-10-CM | POA: Diagnosis not present

## 2019-12-06 DIAGNOSIS — Z79899 Other long term (current) drug therapy: Secondary | ICD-10-CM | POA: Diagnosis not present

## 2019-12-06 DIAGNOSIS — Z87891 Personal history of nicotine dependence: Secondary | ICD-10-CM | POA: Insufficient documentation

## 2019-12-06 DIAGNOSIS — R1310 Dysphagia, unspecified: Secondary | ICD-10-CM | POA: Insufficient documentation

## 2019-12-06 DIAGNOSIS — K449 Diaphragmatic hernia without obstruction or gangrene: Secondary | ICD-10-CM | POA: Diagnosis not present

## 2019-12-06 DIAGNOSIS — K219 Gastro-esophageal reflux disease without esophagitis: Secondary | ICD-10-CM

## 2019-12-06 HISTORY — PX: MALONEY DILATION: SHX5535

## 2019-12-06 HISTORY — PX: ESOPHAGOGASTRODUODENOSCOPY: SHX5428

## 2019-12-06 SURGERY — EGD (ESOPHAGOGASTRODUODENOSCOPY)
Anesthesia: Moderate Sedation

## 2019-12-06 MED ORDER — LIDOCAINE VISCOUS HCL 2 % MT SOLN
OROMUCOSAL | Status: DC | PRN
  Administered 2019-12-06: 1 via OROMUCOSAL

## 2019-12-06 MED ORDER — ONDANSETRON HCL 4 MG/2ML IJ SOLN
INTRAMUSCULAR | Status: DC | PRN
  Administered 2019-12-06: 4 mg via INTRAVENOUS

## 2019-12-06 MED ORDER — MEPERIDINE HCL 50 MG/ML IJ SOLN
INTRAMUSCULAR | Status: AC
  Filled 2019-12-06: qty 1

## 2019-12-06 MED ORDER — MIDAZOLAM HCL 5 MG/5ML IJ SOLN
INTRAMUSCULAR | Status: DC | PRN
  Administered 2019-12-06 (×4): 2 mg via INTRAVENOUS
  Administered 2019-12-06 (×2): 1 mg via INTRAVENOUS

## 2019-12-06 MED ORDER — STERILE WATER FOR IRRIGATION IR SOLN
Status: DC | PRN
  Administered 2019-12-06: 2.5 mL

## 2019-12-06 MED ORDER — SODIUM CHLORIDE 0.9 % IV SOLN: INTRAVENOUS | Status: DC

## 2019-12-06 MED ORDER — MEPERIDINE HCL 100 MG/ML IJ SOLN
INTRAMUSCULAR | Status: DC | PRN
  Administered 2019-12-06: 25 mg via INTRAVENOUS
  Administered 2019-12-06: 10 mg via INTRAVENOUS
  Administered 2019-12-06: 15 mg via INTRAVENOUS

## 2019-12-06 MED ORDER — LIDOCAINE VISCOUS HCL 2 % MT SOLN
OROMUCOSAL | Status: AC
  Filled 2019-12-06: qty 15

## 2019-12-06 MED ORDER — MIDAZOLAM HCL 5 MG/5ML IJ SOLN
INTRAMUSCULAR | Status: AC
  Filled 2019-12-06: qty 10

## 2019-12-06 MED ORDER — ONDANSETRON HCL 4 MG/2ML IJ SOLN
INTRAMUSCULAR | Status: AC
  Filled 2019-12-06: qty 2

## 2019-12-06 NOTE — H&P (Signed)
@LOGO @   Primary Care Physician:  Administration, Hotel manager:  Dr. Gala Romney  Pre-Procedure History & Physical: HPI:  [name] is a 58 y.o. male here for further evaluation of esophageal dysphagia, longstanding GERD.  Here for EGD with esophageal dilation as feasible/appropriate per plan.  Past Medical History:  Diagnosis Date  . Adenocarcinoma of sigmoid colon 2008  . Carpal tunnel syndrome   . GERD (gastroesophageal reflux disease)   . Hypokalemia   . Shortness of breath dyspnea    on exertion  . Umbilical hernia     Past Surgical History:  Procedure Laterality Date  . APPENDECTOMY  1982  . CARPAL TUNNEL RELEASE    . colon resection    . COLONOSCOPY  12/30/2009   Dr. Gala Romney- s/p segmental L colon resection with anastomosis identified at 22cm. no adenomatous change or malignancy identified.   . COLONOSCOPY   01/11/2008   RMR:  Colonic mucosa from the colostomy to the cecum appeared normal.  . COLONOSCOPY   07/09/2008   RMR: Normal rectum, surgical anastomosis was 22 cm, polypoid tissue at anastomosis that was a recurrent neoplasm, it was removed with hot snare.  The remainder of residual colon appeared normal.  Relative poor prep on the right side made exam more challenging.  Marland Kitchen COLONOSCOPY  12/16/2012   Procedure: COLONOSCOPY;  Surgeon: Daneil Dolin, MD;  [facility];  Service: Endoscopy;  Laterality: N/A;  8:30  . COLONOSCOPY N/A 06/03/2016   Dr. Gala Romney: Normal surgical anastomosis at 25 cm from the anal verge. Grade 1 hemorrhoids. 4 mm tubular adenoma removed from the descending colon. Next colonoscopy June 2022.  . ESOPHAGOGASTRODUODENOSCOPY  08/11/12   [place]-erosive reflux esophagitis, distal esophageal ring/superimposed stricture s/p 19F Maloney, hiatal hernia, gastritis/duodenitis secondary to NSAIDs. Biopsy benign.  Marland Kitchen FLEXIBLE SIGMOIDOSCOPY    07/08/2007   RMR: Lobulated 1 cm rectal polyp at 10 cm, resected as described above/  Multiple distal sigmoid diminutive polyps ablated with the tip of hot snare cautery unit/Obstructing apple core neoplasm beginning at 35 cm from the anal verge, biopsied multiple times  . INCISIONAL HERNIA REPAIR N/A 09/10/2016   Procedure: LAPAROSCOPIC LYSIS OF ADHESIONS; LAPAROSCOPIC REPAIR OF INCISIONAL HERNIA REPAIR WITH MESH;  Surgeon: Jackolyn Confer, MD;  Location: [facility];  Service: General;  Laterality: N/A;  . INSERTION OF MESH N/A 09/10/2016   Procedure: INSERTION OF MESH;  Surgeon: Jackolyn Confer, MD;  Location: [facility];  Service: General;  Laterality: N/A;    Prior to Admission medications   Medication Sig Start Date End Date Taking? Authorizing Provider  cyclobenzaprine (FLEXERIL) 10 MG tablet Take 10 mg by mouth 2 (two) times daily as needed for muscle spasms.   Yes [provider]  omeprazole (PRILOSEC) 20 MG capsule Take 40 mg by mouth 2 (two) times daily before a meal.    Yes [provider]  polyethylene glycol (MIRALAX / GLYCOLAX) packet Take 17 g by mouth 2 (two) times daily.   Yes [provider]  simvastatin (ZOCOR) 40 MG tablet Take 20 mg by mouth daily.    Yes [provider]  linaclotide Rolan Lipa) 72 MCG capsule Take 1 capsule (72 mcg total) by mouth daily before breakfast. 11/06/19   Burney Calzad[place], Cristopher Estimable, MD    Allergies as of 10/27/2019 - Review Complete 07/13/2017  Allergen Reaction Noted  . Codeine Nausea And Vomiting 06/26/2011    Family History  Problem Relation Age of Onset  . Emphysema Father   .  Heart attack Father     Social History   Socioeconomic History  . Marital status: Married    Spouse name: Not on file  . Number of children: 1  . Years of education: Not on file  . Highest education level: Not on file  Occupational History  . Occupation: [employer]    Employer: [employer]  Tobacco Use  . Smoking status: Former Research scientist (life sciences)  . Smokeless tobacco: Never Used  Substance and Sexual Activity  . Alcohol  use: No  . Drug use: No  . Sexual activity: Yes  Other Topics Concern  . Not on file  Social History Narrative   Lives with wife & daughter         Social Determinants of Health   Financial Resource Strain:   . Difficulty of Paying Living Expenses: Not on file  Food Insecurity:   . Worried About Charity fundraiser in the Last Year: Not on file  . Ran Out of Food in the Last Year: Not on file  Transportation Needs:   . Lack of Transportation (Medical): Not on file  . Lack of Transportation (Non-Medical): Not on file  Physical Activity:   . Days of Exercise per Week: Not on file  . Minutes of Exercise per Session: Not on file  Stress:   . Feeling of Stress : Not on file  Social Connections:   . Frequency of Communication with Friends and Family: Not on file  . Frequency of Social Gatherings with Friends and Family: Not on file  . Attends Religious Services: Not on file  . Active Member of Clubs or Organizations: Not on file  . Attends Archivist Meetings: Not on file  . Marital Status: Not on file  Intimate Partner Violence:   . Fear of Current or Ex-Partner: Not on file  . Emotionally Abused: Not on file  . Physically Abused: Not on file  . Sexually Abused: Not on file    Review of Systems: See HPI, otherwise negative ROS  Physical Exam: BP 109/70   Pulse 62   Temp 98 F (36.7 C) (Oral)   Resp 11   Ht 5\' 10"  (1.778 m)   Wt 97.1 kg   SpO2 97%   BMI 30.71 kg/m  General:   Alert,  Well-developed, well-nourished, pleasant and cooperative in NAD Neck:  Supple; no masses or thyromegaly. No significant cervical adenopathy. Lungs:  Clear throughout to auscultation.   No wheezes, crackles, or rhonchi. No acute distress. Heart:  Regular rate and rhythm; no murmurs, clicks, rubs,  or gallops. Abdomen: Non-distended, normal bowel sounds.  Soft and nontender without appreciable mass or hepatosplenomegaly.  Pulses:  Normal pulses noted. Extremities:  Without  clubbing or edema.  Impression/Plan: 58 year old gentleman longstanding GERD recurrent esophageal dysphagia.  Here for EGD with esophageal dilation as feasible/appropriate per plan.  The risks, benefits, limitations, alternatives and imponderables have been reviewed with the patient. Potential for esophageal dilation, biopsy, etc. have also been reviewed.  Questions have been answered. All parties agreeable.     Notice: This dictation was prepared with Dragon dictation along with smaller phrase technology. Any transcriptional errors that result from this process are unintentional and may not be corrected upon review.

## 2019-12-06 NOTE — Discharge Instructions (Signed)

## 2019-12-06 NOTE — Op Note (Signed)
[facility] Patient Name: [name] Procedure Date: 12/06/2019 2:00 PM MRN: [id number] Date of Birth: 1961-06-04 Attending MD: Norvel Richards , MD CSN: [id number] Age: 58 Admit Type: Outpatient Procedure:                Upper GI endoscopy Indications:              Dysphagia Providers:                Norvel Richards, MD, Lurline Del, RN, Aram Candela Referring MD:              Medicines:                Midazolam 10 mg IV, Meperidine 50 mg IV Complications:            No immediate complications. Estimated Blood Loss:     Estimated blood loss: none. Procedure:                Pre-Anesthesia Assessment:                           - Prior to the procedure, a History and Physical                            was performed, and patient medications and                            allergies were reviewed. The patient's tolerance of                            previous anesthesia was also reviewed. The risks                            and benefits of the procedure and the sedation                            options and risks were discussed with the patient.                            All questions were answered, and informed consent                            was obtained. Prior Anticoagulants: The patient has                            taken no previous anticoagulant or antiplatelet                            agents. ASA Grade Assessment: II - A patient with                            mild systemic disease. After reviewing the risks  and benefits, the patient was deemed in                            satisfactory condition to undergo the procedure.                           After obtaining informed consent, the endoscope was                            passed under direct vision. Throughout the                            procedure, the patient's blood pressure, pulse, and                            oxygen saturations were  monitored continuously. The                            [id number]-[id number]) scope was introduced through the                            mouth, and advanced to the second part of duodenum.                            The upper GI endoscopy was accomplished without                            difficulty. The patient tolerated the procedure                            well. Scope In: 2:30:31 PM Scope Out: 2:35:50 PM Total Procedure Duration: 0 hours 5 minutes 19 seconds  Findings:      The examined esophagus was normal.      A small hiatal hernia was present.      The duodenal bulb and second portion of the duodenum were normal. The       scope was withdrawn. Dilation was performed with a Maloney dilator with       mild resistance at 56 Fr. The dilation site was examined following       endoscope reinsertion and showed no change. Estimated blood loss: none. Impression:               - Normal esophagus. Dilated.                           - Small hiatal hernia.                           - Normal duodenal bulb and second portion of the                            duodenum.                           - No specimens collected. Moderate Sedation:      Moderate (conscious) sedation was administered  by the endoscopy nurse       and supervised by the endoscopist. The following parameters were       monitored: oxygen saturation, heart rate, blood pressure, respiratory       rate, EKG, adequacy of pulmonary ventilation, and response to care.       Total physician intraservice time was 21 minutes. Recommendation:           - Patient has a contact number available for                            emergencies. The signs and symptoms of potential                            delayed complications were discussed with the                            patient. Return to normal activities tomorrow.                            Written discharge instructions were provided to the                            patient.                            - Advance diet as tolerated.                           - Continue present medications. Stop omeprazole;                            begin Protonix 40 mg daily.                           - Return to my office in 3 months. Procedure Code(s):        --- Professional ---                           [phone], Esophagogastroduodenoscopy, flexible,                            transoral; diagnostic, including collection of                            specimen(s) by brushing or washing, when performed                            (separate procedure)                           43450, Dilation of esophagus, by unguided sound or                            bougie, single or multiple passes  G0500, Moderate sedation services provided by the                            same physician or other qualified health care                            professional performing a gastrointestinal                            endoscopic service that sedation supports,                            requiring the presence of an independent trained                            observer to assist in the monitoring of the                            patient's level of consciousness and physiological                            status; initial 15 minutes of intra-service time;                            patient age 33 years or older (additional time may                            be reported with [id number], as appropriate) Diagnosis Code(s):        --- Professional ---                           K44.9, Diaphragmatic hernia without obstruction or                            gangrene                           R13.10, Dysphagia, unspecified CPT copyright 2019 American Medical Association. All rights reserved. The codes documented in this report are preliminary and upon coder review may  be revised to meet current compliance requirements. Cristopher Estimable. Willmer Fellers, MD Norvel Richards, MD 12/06/2019 2:42:38  PM This report has been signed electronically. Number of Addenda: 0

## 2019-12-19 ENCOUNTER — Telehealth: Payer: Self-pay | Admitting: Internal Medicine

## 2019-12-19 NOTE — Telephone Encounter (Signed)
Pt called to say that the medication RMR put him on wasn't helping. He got it from [facility] and was asking if there was something else that could be called into the [place]. He said it started with a P and was like metamucil. [phone]

## 2019-12-20 NOTE — Telephone Encounter (Signed)
Spoke with pt. Mediation given for constipation is working and pt would like the [place] to reconsider the Linzess. Pt was also changed from Omeprazole to Pantoprazole after his procedure and [facility]. Written script was given at the time of pts visit. Pt took the RX to his local pharmacy.   [facility] and was [facility] the pharmacy. The pharmacy didn't pickup. Will contact them back  [phone]) [phone]

## 2019-12-21 NOTE — Telephone Encounter (Signed)
Spoke with the [facility] pharmacy. New RX for Linzess 72 mcg was faxed to them along with recent ov notes. [phone] fax

## 2020-01-18 ENCOUNTER — Telehealth: Payer: Self-pay

## 2020-01-18 NOTE — Telephone Encounter (Signed)
Cheri from the [place] called to get a letter stating pt d/c omeprazole and started Pantoprazole. Sent EGD report to [phone].

## 2020-03-05 ENCOUNTER — Encounter: Payer: Self-pay | Admitting: Internal Medicine

## 2020-03-05 ENCOUNTER — Other Ambulatory Visit: Payer: Self-pay

## 2020-03-05 ENCOUNTER — Ambulatory Visit (INDEPENDENT_AMBULATORY_CARE_PROVIDER_SITE_OTHER): Payer: No Typology Code available for payment source | Admitting: Internal Medicine

## 2020-03-05 VITALS — BP 112/74 | HR 77 | Temp 97.3°F | Ht 70.0 in | Wt 215.4 lb

## 2020-03-05 DIAGNOSIS — R1310 Dysphagia, unspecified: Secondary | ICD-10-CM | POA: Diagnosis not present

## 2020-03-05 DIAGNOSIS — K219 Gastro-esophageal reflux disease without esophagitis: Secondary | ICD-10-CM

## 2020-03-05 NOTE — Progress Notes (Signed)
Primary Care Physician:  Administration, Hotel manager:  Dr. Gala Romney  Pre-Procedure History & Physical: HPI:  [name] is a 59 y.o. male here for follow-up of GERD and dysphagia.  He had a normal-appearing esophagus  - EGD last fall.  49 [place] Maloney dilator passed.  Omeprazole switched to Protonix 40 mg daily.  No upper GI symptoms currently.  Dysphagia has resolved;  reflux well controlled-better on Protonix 40 mg daily. Linzess 72 daily does not feel it is working asi good as it should - actually still has to strain.  Takes supplemental MiraLAX with diarrhea on occasion.  No bleeding.  Distant history of colon cancer,  small adenoma removed from his colon 4 years ago-due for surveillance colonoscopy in 1 year.  He is having some back issues.  Sounds like he has radiculopathy -says he is seeing Dr. Ellene Route in the near future.  Past Medical History:  Diagnosis Date  . Adenocarcinoma of sigmoid colon 2008  . Carpal tunnel syndrome   . GERD (gastroesophageal reflux disease)   . Hypokalemia   . Shortness of breath dyspnea    on exertion  . Umbilical hernia     Past Surgical History:  Procedure Laterality Date  . APPENDECTOMY  1982  . CARPAL TUNNEL RELEASE    . colon resection    . COLONOSCOPY  12/30/2009   Dr. Gala Romney- s/p segmental L colon resection with anastomosis identified at 22cm. no adenomatous change or malignancy identified.   . COLONOSCOPY   01/11/2008   RMR:  Colonic mucosa from the colostomy to the cecum appeared normal.  . COLONOSCOPY   07/09/2008   RMR: Normal rectum, surgical anastomosis was 22 cm, polypoid tissue at anastomosis that was a recurrent neoplasm, it was removed with hot snare.  The remainder of residual colon appeared normal.  Relative poor prep on the right side made exam more challenging.  Marland Kitchen COLONOSCOPY  12/16/2012   Procedure: COLONOSCOPY;  Surgeon: Daneil Dolin, MD;  Location: [facility];  Service: Endoscopy;   Laterality: N/A;  8:30  . COLONOSCOPY N/A 06/03/2016   Dr. Gala Romney: Normal surgical anastomosis at 25 cm from the anal verge. Grade 1 hemorrhoids. 4 mm tubular adenoma removed from the descending colon. Next colonoscopy June 2022.  . ESOPHAGOGASTRODUODENOSCOPY  08/11/12   Karis Rilling-erosive reflux esophagitis, distal esophageal ring/superimposed stricture s/p 22F Maloney, hiatal hernia, gastritis/duodenitis secondary to NSAIDs. Biopsy benign.  . ESOPHAGOGASTRODUODENOSCOPY N/A 12/06/2019   Procedure: ESOPHAGOGASTRODUODENOSCOPY (EGD);  Surgeon: Daneil Dolin, MD;  Location: [facility];  Service: Endoscopy;  Laterality: N/A;  2:30pm  . FLEXIBLE SIGMOIDOSCOPY    07/08/2007   RMR: Lobulated 1 cm rectal polyp at 10 cm, resected as described above/ Multiple distal sigmoid diminutive polyps ablated with the tip of hot snare cautery unit/Obstructing apple core neoplasm beginning at 35 cm from the anal verge, biopsied multiple times  . INCISIONAL HERNIA REPAIR N/A 09/10/2016   Procedure: LAPAROSCOPIC LYSIS OF ADHESIONS; LAPAROSCOPIC REPAIR OF INCISIONAL HERNIA REPAIR WITH MESH;  Surgeon: Jackolyn Confer, MD;  Location: [facility];  Service: General;  Laterality: N/A;  . INSERTION OF MESH N/A 09/10/2016   Procedure: INSERTION OF MESH;  Surgeon: Jackolyn Confer, MD;  Location: [facility];  Service: General;  Laterality: N/A;  Venia Minks DILATION N/A 12/06/2019   Procedure: Keturah Shavers;  Surgeon: Daneil Dolin, MD;  Location: [facility];  Service: Endoscopy;  Laterality: N/A;    Prior to Admission medications   Medication Sig  Start Date End Date Taking? Authorizing Provider  cyclobenzaprine (FLEXERIL) 10 MG tablet Take 10 mg by mouth 2 (two) times daily as needed for muscle spasms.   Yes [provider]  gabapentin (NEURONTIN) 300 MG capsule Take 300 mg by mouth daily. 01/30/20  Yes [provider]  linaclotide Rolan Lipa) 72 MCG capsule Take 1 capsule (72 mcg total) by mouth daily before  breakfast. 11/06/19  Yes Sohrab Keelan, Cristopher Estimable, MD  pantoprazole (PROTONIX) 40 MG tablet Take 40 mg by mouth daily.   Yes [provider]  polyethylene glycol (MIRALAX / GLYCOLAX) packet Take 17 g by mouth daily.    Yes [provider]  simvastatin (ZOCOR) 40 MG tablet Take 20 mg by mouth daily.    Yes [provider]    Allergies as of 03/05/2020 - Review Complete 03/05/2020  Allergen Reaction Noted  . Codeine Nausea And Vomiting 06/26/2011    Family History  Problem Relation Age of Onset  . Emphysema Father   . Heart attack Father     Social History   Socioeconomic History  . Marital status: Married    Spouse name: Not on file  . Number of children: 1  . Years of education: Not on file  . Highest education level: Not on file  Occupational History  . Occupation: [employer]    Employer: [employer]  Tobacco Use  . Smoking status: Former Research scientist (life sciences)  . Smokeless tobacco: Never Used  Substance and Sexual Activity  . Alcohol use: No  . Drug use: No  . Sexual activity: Yes  Other Topics Concern  . Not on file  Social History Narrative   Lives with wife & daughter         Social Determinants of Health   Financial Resource Strain:   . Difficulty of Paying Living Expenses:   Food Insecurity:   . Worried About Charity fundraiser in the Last Year:   . Arboriculturist in the Last Year:   Transportation Needs:   . Film/video editor (Medical):   Marland Kitchen Lack of Transportation (Non-Medical):   Physical Activity:   . Days of Exercise per Week:   . Minutes of Exercise per Session:   Stress:   . Feeling of Stress :   Social Connections:   . Frequency of Communication with Friends and Family:   . Frequency of Social Gatherings with Friends and Family:   . Attends Religious Services:   . Active Member of Clubs or Organizations:   . Attends Archivist Meetings:   Marland Kitchen Marital Status:   Intimate Partner Violence:   . Fear of Current or  Ex-Partner:   . Emotionally Abused:   Marland Kitchen Physically Abused:   . Sexually Abused:     Review of Systems: See HPI, otherwise negative ROS  Physical Exam: BP 112/74   Pulse 77   Temp (!) 97.3 F (36.3 C) (Temporal)   Ht 5\' 10"  (1.778 m)   Wt 215 lb 6.4 oz (97.7 kg)   BMI 30.91 kg/m  General:   Alert,  Well-developed, well-nourished, pleasant and cooperative in NAD  Impression/Plan: Very pleasant 59 year old gentleman with GERD and dysphagia  -  status post symptoms markedly improved with switch to Protonix and empiric passage of a Maloney dilator recently. Suboptimal results with Linzess 72.  He is overshooting his appointment he has MiraLAX.  He may be better off Linzess 145  Personal history of colon carcinoma and recent adenomatous polyp;  for surveillance colonoscopy 1 year from now.  I see no reason to change that timetable.  Recommendations:  Continue Protonix 40 mg daily  Stop Miralax and Linzess72.; begin Linzess 145 daily - samples provided  Patient to call in 2 weeks with a progress report.  Colonoscopy 2022  OV 3 months    Notice: This dictation was prepared with Dragon dictation along with smaller phrase technology. Any transcriptional errors that result from this process are unintentional and may not be corrected upon review.

## 2020-03-05 NOTE — Patient Instructions (Signed)
Continue Protonix 40 mg daily  Stop miralax and Linzess72.; begin Linzess 145 daily - samples provided  Call me in 2 weeks and let me know how you are doing.  Colonoscopy 2022  OV 3 months

## 2020-03-13 ENCOUNTER — Telehealth: Payer: Self-pay | Admitting: Internal Medicine

## 2020-03-13 NOTE — Telephone Encounter (Signed)
Yes he can add MiraLAX 17 g daily to the Linzess 145.  However, I want him to come to the office to get 2 weeks worth of samples of Linzess 290 and start them and see if that will do the job without having to take so much MiraLAX

## 2020-03-13 NOTE — Telephone Encounter (Signed)
[phone] patient called and said his medication dose was increased, but he is now constipated and can not use the bathroom at all

## 2020-03-13 NOTE — Telephone Encounter (Signed)
Spoke with pt. Pt notified of RMR recommendations. Pt isn't sure if he wants to take the Linzess 290 mcg yet. Pt has Miralax at home and may add it first. Pt will call back if he wants to try Linzess 290 mcg samples.

## 2020-03-13 NOTE — Telephone Encounter (Signed)
Spoke with pt. Pt was seen last week and was asked to d/c Miralax and Linzess 72 mcg. Pt started Linzess 145 mcg and hasn't had a BM in 3 days. Pt states he is constipating and reports no abdominal pain. Pt would like to know if he should add Miralax with the Linzess 145 mcg.

## 2020-03-26 ENCOUNTER — Telehealth: Payer: Self-pay | Admitting: Internal Medicine

## 2020-03-26 NOTE — Telephone Encounter (Signed)
Pt called to let us know that he went from [place] 72 to Linzess 145 and it's gotten worse. He said he stopped taking it and started taking Miralax and that was working fine for him.

## 2020-03-27 NOTE — Telephone Encounter (Signed)
Left a detailed message for pt. Pt can call back if he needs anything. Pt is having daily BM with Miralax.

## 2020-03-30 NOTE — Telephone Encounter (Signed)
So not good with linzess;  recommend continuing Miralax so long as it is working well for him.

## 2020-04-01 NOTE — Telephone Encounter (Signed)
Noted  

## 2020-05-28 ENCOUNTER — Other Ambulatory Visit: Payer: Self-pay

## 2020-05-28 ENCOUNTER — Encounter: Payer: Self-pay | Admitting: Gastroenterology

## 2020-05-28 ENCOUNTER — Ambulatory Visit (INDEPENDENT_AMBULATORY_CARE_PROVIDER_SITE_OTHER): Payer: No Typology Code available for payment source | Admitting: Gastroenterology

## 2020-05-28 VITALS — BP 105/68 | HR 58 | Temp 97.1°F | Ht 70.0 in | Wt 212.8 lb

## 2020-05-28 DIAGNOSIS — K824 Cholesterolosis of gallbladder: Secondary | ICD-10-CM | POA: Diagnosis not present

## 2020-05-28 DIAGNOSIS — K219 Gastro-esophageal reflux disease without esophagitis: Secondary | ICD-10-CM | POA: Insufficient documentation

## 2020-05-28 DIAGNOSIS — K5900 Constipation, unspecified: Secondary | ICD-10-CM

## 2020-05-28 NOTE — Patient Instructions (Signed)
1. Continue pantoprazole 40 mg daily before breakfast for reflux. 2. Continue MiraLAX up to 1 capful twice daily for constipation.  If MiraLAX becomes less effective, please let us know and we can try other prescription options. 3. I will request records from the [place] regarding last ultrasound of your gallbladder.  We should continue to follow gallbladder polyp per current guidelines.  Further recommendations to follow.  If he has not heard from our practice in 2 weeks, please give [place] a call. 4. Your next colonoscopy is in June 2022.  We will send reminder.  At that time we can see if [facility] will agree to [facility] for procedure locally if you desire.

## 2020-05-28 NOTE — Progress Notes (Signed)
Primary Care Physician: Administration, Veterans  Primary Gastroenterologist:  Garfield Cornea, MD   Chief Complaint  Patient presents with  . Gastroesophageal Reflux    burps some but doing better since med change    HPI: [name] is a 59 y.o. male here for follow-up.  He has a history of remote segmental resection of the sigmoid colon for limited stage colon cancer.  He is due for surveillance colonoscopy in June 2022.  History of GERD, constipation prior gastric/duodenal erosions and ulcerations.  He underwent a EGD back in December for dysphagia.  Esophagus was normal.  Esophagus was dilated for history of dysphagia.  He had a small hiatal hernia.  Dysphagia resolved.  Reflux was better controlled on pantoprazole 40 mg daily.  For his constipation he has tried Linzess 72 and 145 mcg and then 262mcg but did not tolerate.  Ultimately went back to MiraLAX daily. Patient has a history of 4 mm gallbladder polyp noted on ultrasound in June 2017, stable in July 2018 on ultrasound.  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  Patient was seen by Dr. Arnoldo Morale for further evaluation regarding possible cholecystectomy.  Because patient was asymptomatic it was felt at that time that he did not require cholecystectomy.  1 year surveillance ultrasound was recommended.  Patient did not complete.  Patient states when he tried Linzess he had nothing but watery diarrhea.  When 72 mcg daily he would have 5 or 6 watery stools the first hour.  As he increased the dosage the amount of diarrhea he had increased.  He ultimately stopped Linzess.  Currently taking MiraLAX twice a day, not quite an entire capful.  He notes some gas the MiraLAX but overall doing much better.  His reflux is well controlled on pantoprazole.  He is having some occasional burping.  No dysphagia.  No melena or rectal bleeding.  Occasional discomfort in the upper abdomen but really  denies any postprandial abdominal pain.  Current Outpatient Medications  Medication Sig Dispense Refill  . cyclobenzaprine (FLEXERIL) 10 MG tablet Take 10 mg by mouth 2 (two) times daily as needed for muscle spasms.    . pantoprazole (PROTONIX) 40 MG tablet Take 40 mg by mouth daily.    . polyethylene glycol (MIRALAX / GLYCOLAX) packet Take 17 g by mouth 2 (two) times daily.     . simvastatin (ZOCOR) 40 MG tablet Take 20 mg by mouth daily.      No current facility-administered medications for this visit.    Allergies as of 05/28/2020 - Review Complete 05/28/2020  Allergen Reaction Noted  . Codeine Nausea And Vomiting 06/26/2011    ROS:  General: Negative for anorexia, weight loss, fever, chills, fatigue, weakness. ENT: Negative for hoarseness, difficulty swallowing , nasal congestion. CV: Negative for chest pain, angina, palpitations, dyspnea on exertion, peripheral edema.  Respiratory: Negative for dyspnea at rest, dyspnea on exertion, cough, sputum, wheezing.  GI: See history of present illness. GU:  Negative for dysuria, hematuria, urinary incontinence, urinary frequency, nocturnal urination.  Endo: Negative for unusual weight change.    Physical Examination:   BP 105/68   Pulse (!) 58   Temp (!) 97.1 F (36.2 C) (Temporal)   Ht 5\' 10"  (1.778 m)   Wt 212 lb 12.8 oz (96.5 kg)   BMI 30.53 kg/m   General: Well-nourished, well-developed in no acute distress.  Eyes: No icterus. Mouth: masked Lungs: Clear to auscultation bilaterally.  Heart: Regular rate and rhythm, no murmurs rubs or gallops.  Abdomen: Bowel sounds are normal, nontender, nondistended, no hepatosplenomegaly or masses, no abdominal bruits or hernia , no rebound or guarding.   Extremities: No lower extremity edema. No clubbing or deformities. Neuro: Alert and oriented x 4   Skin: Warm and dry, no jaundice.   Psych: Alert and cooperative, normal mood and affect.    Imaging Studies: No results  found.   Impression/plan:  Pleasant 59 year old gentleman with history of GERD/dysphagia status post EGD with esophageal dilation back in December and switched to pantoprazole 40 mg daily.  His reflux is well controlled.  Dysphagia resolved after dilation.  Only has occasional belching but for the most part doing much better.  Personal history of sigmoid colon cancer (remote) status post segmental resection.  Surveillance colonoscopy due June 2022.  He would like to have colonoscopy done locally if the [place] will allow.  We will send reminder when he is due.  If he has any bowel changes, blood in the stool, new alarm symptoms he will let us know in the interim.  Constipation currently doing better on MiraLAX twice daily.  If he loses efficacy, he will let me know we can try other prescription options.  He is failed on Linzess due to significant diarrhea with all doses.  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  Based on guidelines he was due for follow-up ultrasound for gallbladder polyp surveillance in 2019.  He believes he may have had this done through the [place] system, we will request records.  After review of records, if he requires updating ultrasound at this time, patient would prefer to have done locally if possible.

## 2020-10-03 ENCOUNTER — Telehealth: Payer: Self-pay | Admitting: Gastroenterology

## 2020-10-03 NOTE — Telephone Encounter (Signed)
Please let pt know we have tried several [facility] without [facility].  He is due one year surveillance ruq u/s for gallbladder polyp/adenomyomatosis.   Please arrange locally if [facility] will allow.

## 2020-10-07 NOTE — Telephone Encounter (Signed)
Left a detailed message letting pt know Neil Crouch PA's recommendations and that she wasn't able to get the u/s report from the [place]. Marland Kitchen

## 2021-06-02 ENCOUNTER — Encounter: Payer: Self-pay | Admitting: Internal Medicine

## 2021-08-26 ENCOUNTER — Encounter: Payer: Self-pay | Admitting: Internal Medicine

## 2022-01-09 ENCOUNTER — Ambulatory Visit: Payer: Federal, State, Local not specified - PPO | Admitting: Gastroenterology

## 2022-01-09 ENCOUNTER — Ambulatory Visit (INDEPENDENT_AMBULATORY_CARE_PROVIDER_SITE_OTHER): Payer: Self-pay | Admitting: *Deleted

## 2022-01-09 ENCOUNTER — Other Ambulatory Visit: Payer: Self-pay

## 2022-01-09 VITALS — Ht 70.0 in | Wt 189.0 lb

## 2022-01-09 DIAGNOSIS — Z86010 Personal history of colonic polyps: Secondary | ICD-10-CM

## 2022-01-09 NOTE — Progress Notes (Addendum)
Gastroenterology Pre-Procedure Review  Request Date: 01/09/2022 Requesting Physician: [facility], 5 year recall, Last TCS done 06/03/2016 by Dr. Gala Romney, tubular adenoma, hx of colon and lymph node cancer in 2008  PATIENT REVIEW QUESTIONS: The patient responded to the following health history questions as indicated:    1. Diabetes Melitis: no 2. Joint replacements in the past 12 months: no 3. Major health problems in the past 3 months: no 4. Has an artificial valve or MVP: no 5. Has a defibrillator: no 6. Has been advised in past to take antibiotics in advance of a procedure like teeth cleaning: no 7. Family history of colon cancer: no, just himself in 2008  8. Alcohol Use: no 9. Illicit drug Use: no 10. History of sleep apnea: yes, doesn't use CPAP, uses mouthpiece instead 11. History of coronary artery or other vascular stents placed within the last 12 months: no 12. History of any prior anesthesia complications: no 13. Body mass index is 27.12 kg/m.    MEDICATIONS & ALLERGIES:    Patient reports the following regarding taking any blood thinners:   Plavix? no Aspirin? no Coumadin? no Brilinta? no Xarelto? no Eliquis? no Pradaxa? no Savaysa? no Effient? no  Patient confirms/reports the following medications:  Current Outpatient Medications  Medication Sig Dispense Refill   acetaminophen (TYLENOL) 650 MG CR tablet Take 650 mg by mouth as needed for pain. Takes 2 tablets as needed.     Ascorbic Acid (VITAMIN C) 1000 MG tablet Take 1,000 mg by mouth daily.     pantoprazole (PROTONIX) 40 MG tablet Take 40 mg by mouth 2 (two) times daily at 10 AM and 5 PM.     polyethylene glycol (MIRALAX / GLYCOLAX) packet Take 17 g by mouth 2 (two) times daily.      simvastatin (ZOCOR) 40 MG tablet Take 20 mg by mouth daily.      No current facility-administered medications for this visit.    Patient confirms/reports the following allergies:  Allergies  Allergen Reactions    Codeine Nausea And Vomiting    Pt prefers to have food on stomach if he needs to take med     No orders of the defined types were placed in this encounter.   AUTHORIZATION INFORMATION Primary Insurance: Human resources officer,  ID #: [id number] Pre-Cert / Josem Kaufmann required: Yes, approved by [facility] until 5/99/3570 Pre-Cert / Josem Kaufmann #:  [id number]  SCHEDULE INFORMATION: Procedure has been scheduled as follows:  Date: 03/06/2022, Time: 8:15 Location: [facility] with Dr. Gala Romney  This Gastroenterology Pre-Precedure Review Form is being routed to the following provider(s): Neil Crouch, PA-C

## 2022-01-12 NOTE — Progress Notes (Signed)
Ok to schedule with propofol. ASA 2.

## 2022-01-13 NOTE — Progress Notes (Signed)
Spoke with pt.  He was made aware that I will contact him once Dr. Roseanne Kaufman Mar/Apr procedures are available.

## 2022-02-12 ENCOUNTER — Encounter: Payer: Self-pay | Admitting: *Deleted

## 2022-02-12 MED ORDER — PEG 3350-KCL-NA BICARB-NACL 420 G PO SOLR: 4000.0000 mL | Freq: Once | ORAL | 0 refills | Status: AC

## 2022-02-12 NOTE — Progress Notes (Addendum)
Spoke to pt.  Scheduled procedure for 03/06/2022 with arrival at 6:45 at [facility].  Reviewed prep instructions with pt by phone.  Pt made aware RX sent to pharmacy.  Pt informed that I will mail out instructions.  Confirmed mailing address.  Neil Crouch, PA-C:  No changes in medical hx but pt has started taking Vit E daily.  Updated medication list.

## 2022-02-12 NOTE — Progress Notes (Signed)
Noted  

## 2022-02-12 NOTE — Addendum Note (Signed)
Addended by: Metro Kung on: 02/12/2022 12:40 PM   Modules accepted: Orders

## 2022-02-12 NOTE — Progress Notes (Signed)
Dr. Gala Romney does not require him to hold vitamin E.

## 2022-03-06 ENCOUNTER — Encounter (HOSPITAL_COMMUNITY)
Admission: RE | Disposition: A | Discharge status: Home / Self Care | Payer: Self-pay | Source: Home / Self Care | Attending: Internal Medicine

## 2022-03-06 ENCOUNTER — Other Ambulatory Visit: Payer: Self-pay

## 2022-03-06 ENCOUNTER — Ambulatory Visit (HOSPITAL_COMMUNITY): Payer: No Typology Code available for payment source | Admitting: Anesthesiology

## 2022-03-06 ENCOUNTER — Ambulatory Visit (HOSPITAL_COMMUNITY)
Admission: RE | Admit: 2022-03-06 | Discharge: 2022-03-06 | Disposition: A | Discharge status: Home / Self Care | Payer: No Typology Code available for payment source | Attending: Internal Medicine | Admitting: Internal Medicine

## 2022-03-06 ENCOUNTER — Ambulatory Visit (HOSPITAL_BASED_OUTPATIENT_CLINIC_OR_DEPARTMENT_OTHER): Payer: No Typology Code available for payment source | Admitting: Anesthesiology

## 2022-03-06 ENCOUNTER — Encounter (HOSPITAL_COMMUNITY): Payer: Self-pay | Admitting: Internal Medicine

## 2022-03-06 DIAGNOSIS — Z85038 Personal history of other malignant neoplasm of large intestine: Secondary | ICD-10-CM | POA: Insufficient documentation

## 2022-03-06 DIAGNOSIS — Z9049 Acquired absence of other specified parts of digestive tract: Secondary | ICD-10-CM | POA: Diagnosis not present

## 2022-03-06 DIAGNOSIS — Z87891 Personal history of nicotine dependence: Secondary | ICD-10-CM | POA: Diagnosis not present

## 2022-03-06 DIAGNOSIS — Z1211 Encounter for screening for malignant neoplasm of colon: Secondary | ICD-10-CM | POA: Insufficient documentation

## 2022-03-06 DIAGNOSIS — Z86010 Personal history of colonic polyps: Secondary | ICD-10-CM | POA: Diagnosis not present

## 2022-03-06 DIAGNOSIS — K624 Stenosis of anus and rectum: Secondary | ICD-10-CM | POA: Insufficient documentation

## 2022-03-06 DIAGNOSIS — Z980 Intestinal bypass and anastomosis status: Secondary | ICD-10-CM | POA: Diagnosis not present

## 2022-03-06 DIAGNOSIS — K640 First degree hemorrhoids: Secondary | ICD-10-CM | POA: Diagnosis not present

## 2022-03-06 DIAGNOSIS — K219 Gastro-esophageal reflux disease without esophagitis: Secondary | ICD-10-CM | POA: Insufficient documentation

## 2022-03-06 DIAGNOSIS — J449 Chronic obstructive pulmonary disease, unspecified: Secondary | ICD-10-CM | POA: Insufficient documentation

## 2022-03-06 DIAGNOSIS — Z8711 Personal history of peptic ulcer disease: Secondary | ICD-10-CM | POA: Diagnosis not present

## 2022-03-06 HISTORY — PX: COLONOSCOPY WITH PROPOFOL: SHX5780

## 2022-03-06 SURGERY — COLONOSCOPY WITH PROPOFOL
Anesthesia: General

## 2022-03-06 MED ORDER — LACTATED RINGERS IV SOLN: INTRAVENOUS | Status: DC

## 2022-03-06 MED ORDER — LIDOCAINE HCL (CARDIAC) PF 100 MG/5ML IV SOSY
PREFILLED_SYRINGE | INTRAVENOUS | Status: DC | PRN
  Administered 2022-03-06: 50 mg via INTRAVENOUS

## 2022-03-06 MED ORDER — PROPOFOL 10 MG/ML IV BOLUS
INTRAVENOUS | Status: DC | PRN
  Administered 2022-03-06: 100 mg via INTRAVENOUS

## 2022-03-06 MED ORDER — PROPOFOL 500 MG/50ML IV EMUL
INTRAVENOUS | Status: DC | PRN
  Administered 2022-03-06: 150 ug/kg/min via INTRAVENOUS

## 2022-03-06 NOTE — H&P (Signed)
$'@LOGO'K$ @ ? ? ?Primary Care Physician:  [facility] ?Primary Gastroenterologist:  Dr.  Marland Kitchen ?Pre-Procedure History & Physical: ?HPI:  [name] is a 61 y.o. male here for surveillance colonoscopy.  History of sigmoid colon cancer resected 2008; last colonoscopy 2017 small adenoma removed early diverticulosis.  No bowel symptoms currently. ? ?Past Medical History:  ?Diagnosis Date  ? Adenocarcinoma of sigmoid colon 2008  ? Carpal tunnel syndrome   ? GERD (gastroesophageal reflux disease)   ? Hypokalemia   ? Shortness of breath dyspnea   ? on exertion  ? Umbilical hernia   ? ? ?Past Surgical History:  ?Procedure Laterality Date  ? APPENDECTOMY  1982  ? CARPAL TUNNEL RELEASE    ? colon resection    ? COLONOSCOPY  12/30/2009  ? Dr. Gala Romney- s/p segmental L colon resection with anastomosis identified at 22cm. no adenomatous change or malignancy identified.   ? COLONOSCOPY   01/11/2008  ? RMR:  Colonic mucosa from the colostomy to the cecum appeared normal.  ? COLONOSCOPY   07/09/2008  ? RMR: Normal rectum, surgical anastomosis was 22 cm, polypoid tissue at anastomosis that was a recurrent neoplasm, it was removed with hot snare.  The remainder of residual colon appeared normal.  Relative poor prep on the right side made exam more challenging.  ? COLONOSCOPY  12/16/2012  ? Procedure: COLONOSCOPY;  Surgeon: Daneil Dolin, MD;  Location: [facility];  Service: Endoscopy;  Laterality: N/A;  8:30  ? COLONOSCOPY N/A 06/03/2016  ? Dr. Gala Romney: Normal surgical anastomosis at 25 cm from the anal verge. Grade 1 hemorrhoids. 4 mm tubular adenoma removed from the descending colon. Next colonoscopy June 2022.  ? ESOPHAGOGASTRODUODENOSCOPY  08/11/12  ? Charidy Cappelletti-erosive reflux esophagitis, distal esophageal ring/superimposed stricture s/p 68F [place], hiatal hernia, gastritis/duodenitis secondary to NSAIDs. Biopsy benign.  ? ESOPHAGOGASTRODUODENOSCOPY N/A 12/06/2019  ? Yaqueline Gutter: normal esophageal s/p dilation, small hiatal hernia   ? FLEXIBLE SIGMOIDOSCOPY    07/08/2007  ? RMR: Lobulated 1 cm rectal polyp at 10 cm, resected as described above/ Multiple distal sigmoid diminutive polyps ablated with the tip of hot snare cautery unit/Obstructing apple core neoplasm beginning at 35 cm from the anal verge, biopsied multiple times  ? INCISIONAL HERNIA REPAIR N/A 09/10/2016  ? Procedure: LAPAROSCOPIC LYSIS OF ADHESIONS; LAPAROSCOPIC REPAIR OF INCISIONAL HERNIA REPAIR WITH MESH;  Surgeon: Jackolyn Confer, MD;  Location: [facility];  Service: General;  Laterality: N/A;  ? INSERTION OF MESH N/A 09/10/2016  ? Procedure: INSERTION OF MESH;  Surgeon: Jackolyn Confer, MD;  Location: [facility];  Service: General;  Laterality: N/A;  ? MALONEY DILATION N/A 12/06/2019  ? Procedure: MALONEY DILATION;  Surgeon: Daneil Dolin, MD;  Location: [facility];  Service: Endoscopy;  Laterality: N/A;  ? ? ?Prior to Admission medications   ?Medication Sig Start Date End Date Taking? Authorizing Provider  ?acetaminophen (TYLENOL) 500 MG tablet Take 1,000 mg by mouth every 6 (six) hours as needed for moderate pain or headache.   Yes [provider]  ?acetaminophen (TYLENOL) 650 MG CR tablet Take 1,300 mg by mouth every 8 (eight) hours as needed (arthritis pain only).   Yes [provider]  ?Ascorbic Acid (VITAMIN C) 1000 MG tablet Take 1,000 mg by mouth daily.   Yes [provider]  ?baci-polymyx-neo-hydrocort (CORTISPORIN) 1 % ointment 1 application. at bedtime. Apply to both ears at night   Yes [provider]  ?diclofenac Sodium (VOLTAREN) 1 % GEL Apply 1 application. topically  3 (three) times daily.   Yes [provider]  ?fluticasone-salmeterol (ADVAIR) 100-50 MCG/ACT AEPB Inhale 1 puff into the lungs 2 (two) times daily.   Yes [provider]  ?lidocaine (LIDODERM) 5 % Place 1 patch onto the skin daily as needed (arthritis pain (thumb)). Remove & Discard patch within 12 hours or as directed by MD   Yes [provider]  ?pantoprazole (PROTONIX) 40 MG tablet Take 40 mg by mouth 2 (two) times daily at 10 AM and 5 PM. (1000 & 1700)   Yes [provider]  ?polyethylene glycol (MIRALAX / GLYCOLAX) packet Take 17 g by mouth 2 (two) times daily.    Yes [provider]  ?simvastatin (ZOCOR) 40 MG tablet Take 20 mg by mouth every evening.   Yes [provider]  ?Vitamin E 450 MG (1000 UT) CAPS Take 1 capsule by mouth in the morning.   Yes [provider]  ?albuterol (VENTOLIN HFA) 108 (90 Base) MCG/ACT inhaler Inhale 2 puffs into the lungs every 6 (six) hours as needed for wheezing or shortness of breath.    [provider]  ? ? ?Allergies as of 02/12/2022 - Review Complete 01/09/2022  ?Allergen Reaction Noted  ? Codeine Nausea And Vomiting 06/26/2011  ? ? ?Family History  ?Problem Relation Age of Onset  ? Emphysema Father   ? Heart attack Father   ? ? ?Social History  ? ?Socioeconomic History  ? Marital status: Married  ?  Spouse name: Not on file  ? Number of children: 1  ? Years of education: Not on file  ? Highest education level: Not on file  ?Occupational History  ? Occupation: [employer]  ?  Employer: [employer]  ?Tobacco Use  ? Smoking status: Former  ? Smokeless tobacco: Never  ?Vaping Use  ? Vaping Use: Never used  ?Substance and Sexual Activity  ? Alcohol use: No  ? Drug use: No  ? Sexual activity: Yes  ?Other Topics Concern  ? Not on file  ?Social History Narrative  ? Lives with wife & daughter  ?   ?   ? ?Social Determinants of Health  ? ?Financial Resource Strain: Not on file  ?Food Insecurity: Not on file  ?Transportation Needs: Not on file  ?Physical Activity: Not on file  ?Stress: Not on file  ?Social Connections: Not on file  ?Intimate Partner Violence: Not on file  ? ? ?Review of Systems: ?See HPI, otherwise negative ROS ? ?Physical Exam: ?BP 121/69   Pulse 76   Temp 98 ?F (36.7 ?C) (Oral)   Resp 15   Ht '5\' 10"'$  (1.778 m)   Wt 86.2 kg   SpO2  97%   BMI 27.26 kg/m?  ?General:   Alert,  Well-developed, well-nourished, pleasant and cooperative in NAD ?Mouth:  No deformity or lesions. ?Neck:  Supple; no masses or thyromegaly. No significant cervical adenopathy. ?Lungs:  Clear throughout to auscultation.   No wheezes, crackles, or rhonchi. No acute distress. ?Heart:  Regular rate and rhythm; no murmurs, clicks, rubs,  or gallops. ?Abdomen: Non-distended, normal bowel sounds.  Soft and nontender without appreciable mass or hepatosplenomegaly.  ?Pulses:  Normal pulses noted. ?Extremities:  Without clubbing or edema. ? ?Impression/Plan: 60 year old gentleman with a history of sigmoid colon cancer removed 2008.  Last colonoscopy 2017 yielded a small colonic adenoma. ?No bowel symptoms currently. ? ?I have offered the patient a surveillance colonoscopy today per plan. ?The risks, benefits, limitations, alternatives and imponderables  have been reviewed with the patient. Questions have been answered. All parties are agreeable.   ? ? ? ? ? ?Notice: This dictation was prepared with Dragon dictation along with smaller phrase technology. Any transcriptional errors that result from this process are unintentional and may not be corrected upon review.  ? ?

## 2022-03-06 NOTE — Anesthesia Postprocedure Evaluation (Signed)
Anesthesia Post Note ? ?Patient: [name] ? ?Procedure(s) Performed: COLONOSCOPY WITH PROPOFOL ? ?Patient location during evaluation: Endoscopy ?Anesthesia Type: General ?Level of consciousness: awake and alert and oriented ?Pain management: pain level controlled ?Vital Signs Assessment: post-procedure vital signs reviewed and stable ?Respiratory status: spontaneous breathing, nonlabored ventilation and respiratory function stable ?Cardiovascular status: blood pressure returned to baseline and stable ?Postop Assessment: no apparent nausea or vomiting ?Anesthetic complications: no ? ? ?No notable events documented. ? ? ?Last Vitals:  ?Vitals:  ? 03/06/22 0733 03/06/22 0834  ?BP: 121/69 99/60  ?Pulse: 76   ?Resp: 15 14  ?Temp: 36.7 ?C 36.4 ?C  ?SpO2: 97% 95%  ?  ?Last Pain:  ?Vitals:  ? 03/06/22 0834  ?TempSrc: Oral  ?PainSc: 8   ? ? ?  ?  ?  ?  ?  ?  ? ?Kelce Bouton C Petros Ahart ? ? ? ? ?

## 2022-03-06 NOTE — Op Note (Signed)
[facility] ?Patient Name: [name] ?Procedure Date: 03/06/2022 7:51 AM ?MRN: [id number] ?Date of Birth: 1961-10-21 ?Attending MD: Norvel Richards , MD ?CSN: [id number] ?Age: 61 ?Admit Type: Outpatient ?Procedure:                Colonoscopy ?Indications:              High risk colon cancer surveillance: Personal  ?                          history of colon cancer ?Providers:                Norvel Richards, MD, Lurline Del, RN, Kenney Houseman  ?                          Wilson ?Referring MD:              ?Medicines:                Propofol per Anesthesia ?Complications:            No immediate complications. ?Estimated Blood Loss:     Estimated blood loss: none. ?Procedure:                Pre-Anesthesia Assessment: ?                          - Prior to the procedure, a History and Physical  ?                          was performed, and patient medications and  ?                          allergies were reviewed. The patient's tolerance of  ?                          previous anesthesia was also reviewed. The risks  ?                          and benefits of the procedure and the sedation  ?                          options and risks were discussed with the patient.  ?                          All questions were answered, and informed consent  ?                          was obtained. Prior Anticoagulants: The patient has  ?                          taken no previous anticoagulant or antiplatelet  ?                          agents. ASA Grade Assessment: II - A patient with  ?  mild systemic disease. After reviewing the risks  ?                          and benefits, the patient was deemed in  ?                          satisfactory condition to undergo the procedure. ?                          After obtaining informed consent, the colonoscope  ?                          was passed under direct vision. Throughout the  ?                          procedure, the patient's blood pressure,  pulse, and  ?                          oxygen saturations were monitored continuously. The  ?                          [id number] scope was introduced through the  ?                          anus and advanced to the the cecum, identified by  ?                          appendiceal orifice and ileocecal valve. The  ?                          colonoscopy was performed without difficulty. The  ?                          patient tolerated the procedure well. The quality  ?                          of the bowel preparation was adequate. ?Scope In: 8:18:09 AM ?Scope Out: 8:31:55 AM ?Scope Withdrawal Time: 0 hours 8 minutes 21 seconds  ?Total Procedure Duration: 0 hours 13 minutes 46 seconds  ?Findings: ?     Moderate anal stenosis. Prostate without palpable abnormality. Rectum  ?     negative. ?     Non-bleeding internal hemorrhoids were found during retroflexion. The  ?     hemorrhoids were mild, small and Grade I (internal hemorrhoids that do  ?     not prolapse). Normal-appearing anastomosis at 22 cm. ?     The exam was otherwise without abnormality on direct and retroflexion  ?     views. ?Impression:               - The entire examined colon is normal. Status post  ?                          sigmoid resection. ?                          -  Non-bleeding internal hemorrhoids. ?                          - The examination was otherwise normal on direct  ?                          and retroflexion views. ?                          - No specimens collected. ?Moderate Sedation: ?     Moderate (conscious) sedation was personally administered by an  ?     anesthesia professional. The following parameters were monitored: oxygen  ?     saturation, heart rate, blood pressure, and response to care. ?Recommendation:           - Patient has a contact number available for  ?                          emergencies. The signs and symptoms of potential  ?                          delayed complications were discussed with the  ?                           patient. Return to normal activities tomorrow.  ?                          Written discharge instructions were provided to the  ?                          patient. ?                          - Advance diet as tolerated. ?                          - Continue present medications. ?                          - Repeat colonoscopy in 5 years for surveillance. ?                          - Return to GI office (date not yet determined). ?Procedure Code(s):        --- Professional --- ?                          [phone], Colonoscopy, flexible; diagnostic, including  ?                          collection of specimen(s) by brushing or washing,  ?                          when performed (separate procedure) ?Diagnosis Code(s):        --- Professional --- ?                          T34.287,  Personal history of other malignant  ?                          neoplasm of large intestine ?                          K64.0, First degree hemorrhoids ?CPT copyright 2019 American Medical Association. All rights reserved. ?The codes documented in this report are preliminary and upon coder review may  ?be revised to meet current compliance requirements. ?Cristopher Estimable. Sharaya Boruff, MD ?Norvel Richards, MD ?03/06/2022 8:44:47 AM ?This report has been signed electronically. ?Number of Addenda: 0 ?

## 2022-03-06 NOTE — Discharge Instructions (Addendum)
?  Colonoscopy ?Discharge Instructions ? ?Read the instructions outlined below and refer to this sheet in the next few weeks. These discharge instructions provide you with general information on caring for yourself after you leave the hospital. Your doctor may also give you specific instructions. While your treatment has been planned according to the most current medical practices available, unavoidable complications occasionally occur. If you have any problems or questions after discharge, call Dr. Gala Romney at [phone]. ?ACTIVITY ?You may resume your regular activity, but move at a slower pace for the next 24 hours.  ?Take frequent rest periods for the next 24 hours.  ?Walking will help get rid of the air and reduce the bloated feeling in your belly (abdomen).  ?No driving for 24 hours (because of the medicine (anesthesia) used during the test).   ?Do not sign any important legal documents or operate any machinery for 24 hours (because of the anesthesia used during the test).  ?NUTRITION ?Drink plenty of fluids.  ?You may resume your normal diet as instructed by your doctor.  ?Begin with a light meal and progress to your normal diet. Heavy or fried foods are harder to digest and may make you feel sick to your stomach (nauseated).  ?Avoid alcoholic beverages for 24 hours or as instructed.  ?MEDICATIONS ?You may resume your normal medications unless your doctor tells you otherwise.  ?WHAT YOU CAN EXPECT TODAY ?Some feelings of bloating in the abdomen.  ?Passage of more gas than usual.  ?Spotting of blood in your stool or on the toilet paper.  ?IF YOU HAD POLYPS REMOVED DURING THE COLONOSCOPY: ?No aspirin products for 7 days or as instructed.  ?No alcohol for 7 days or as instructed.  ?Eat a soft diet for the next 24 hours.  ?FINDING OUT THE RESULTS OF YOUR TEST ?Not all test results are available during your visit. If your test results are not back during the visit, make an appointment with your caregiver to find out the  results. Do not assume everything is normal if you have not heard from your caregiver or the medical facility. It is important for you to follow up on all of your test results.  ?SEEK IMMEDIATE MEDICAL ATTENTION IF: ?You have more than a spotting of blood in your stool.  ?Your belly is swollen (abdominal distention).  ?You are nauseated or vomiting.  ?You have a temperature over 101.  ?You have abdominal pain or discomfort that is severe or gets worse throughout the day.  ? ? ? ? ?No sign of recurrent colon cancer.  No polyps seen,  Minimal internal hemorrhoids. ? ?Your your anal opening is a little bit small.  I did not feel any abnormalities regarding your prostate.  However, with any prostate symptoms you should follow-up with your primary care provider ? ?It is recommended you return in 5 years and have another colonoscopy.  It was good seeing you again today! ? ?Patient request, I called Neilson Oehlert [phone] -discussed findings and recommendations ?

## 2022-03-06 NOTE — Transfer of Care (Signed)
Immediate Anesthesia Transfer of Care Note ? ?Patient: [name] ? ?Procedure(s) Performed: COLONOSCOPY WITH PROPOFOL ? ?Patient Location: Endoscopy Unit ? ?Anesthesia Type:General ? ?Level of Consciousness: awake and alert  ? ?Airway & Oxygen Therapy: Patient Spontanous Breathing ? ?Post-op Assessment: Report given to RN and Post -op Vital signs reviewed and stable ? ?Post vital signs: Reviewed and stable ? ?Last Vitals:  ?Vitals Value Taken Time  ?BP    ?Temp    ?Pulse 102   ?Resp 13   ?SpO2 94%   ? ? ?Last Pain:  ?Vitals:  ? 03/06/22 0733  ?TempSrc: Oral  ?PainSc:   ?   ? ?Patients Stated Pain Goal: 5 (03/06/22 0720) ? ?Complications: No notable events documented. ?

## 2022-03-06 NOTE — Anesthesia Preprocedure Evaluation (Addendum)
Anesthesia Evaluation  ?Patient identified by MRN, date of birth, ID band ?Patient awake ? ? ? ?Reviewed: ?Allergy & Precautions, NPO status , Patient's Chart, lab work & pertinent test results ? ?Airway ?Mallampati: II ? ?TM Distance: >3 FB ?Neck ROM: Full ? ? ? Dental ? ?(+) Dental Advisory Given, Missing, Caps ?  ?Pulmonary ?shortness of breath and with exertion, COPD,  COPD inhaler, former smoker,  ?  ?Pulmonary exam normal ?breath sounds clear to auscultation ? ? ? ? ? ? Cardiovascular ?negative cardio ROS ?Normal cardiovascular exam ?Rhythm:Regular Rate:Normal ? ? ?  ?Neuro/Psych ? Neuromuscular disease negative psych ROS  ? GI/Hepatic ?Neg liver ROS, PUD, GERD  Medicated,  ?Endo/Other  ?negative endocrine ROS ? Renal/GU ?negative Renal ROS  ?negative genitourinary ?  ?Musculoskeletal ?negative musculoskeletal ROS ?(+)  ? Abdominal ?  ?Peds ?negative pediatric ROS ?(+)  Hematology ?negative hematology ROS ?(+)   ?Anesthesia Other Findings ? ? Reproductive/Obstetrics ?negative OB ROS ? ?  ? ? ? ? ? ? ? ? ? ? ? ? ? ?  ?  ? ? ? ? ? ? ? ?Anesthesia Physical ?Anesthesia Plan ? ?ASA: 2 ? ?Anesthesia Plan: General  ? ?Post-op Pain Management: Minimal or no pain anticipated  ? ?Induction: Intravenous ? ?PONV Risk Score and Plan: TIVA ? ?Airway Management Planned: Nasal Cannula and Natural Airway ? ?Additional Equipment:  ? ?Intra-op Plan:  ? ?Post-operative Plan:  ? ?Informed Consent: I have reviewed the patients History and Physical, chart, labs and discussed the procedure including the risks, benefits and alternatives for the proposed anesthesia with the patient or authorized representative who has indicated his/her understanding and acceptance.  ? ? ? ?Dental advisory given ? ?Plan Discussed with: CRNA and Surgeon ? ?Anesthesia Plan Comments:   ? ? ? ? ? ? ?Anesthesia Quick Evaluation ? ?

## 2022-03-11 ENCOUNTER — Encounter (HOSPITAL_COMMUNITY): Payer: Self-pay | Admitting: Internal Medicine
